# Patient Record
Sex: Male | Born: 1960 | Race: Black or African American | Hispanic: No | Marital: Married | Smoking: Never smoker
Health system: Southern US, Community
[De-identification: ages and names within clinical notes are randomized; demographics above are authoritative.]

## PROBLEM LIST (undated history)

## (undated) DIAGNOSIS — I10 Essential (primary) hypertension: Secondary | ICD-10-CM

---

## 2001-02-09 ENCOUNTER — Encounter: Payer: Self-pay | Admitting: Family Medicine

## 2001-02-09 ENCOUNTER — Ambulatory Visit (HOSPITAL_COMMUNITY): Admission: RE | Admit: 2001-02-09 | Discharge: 2001-02-09 | Payer: Self-pay | Admitting: Family Medicine

## 2001-08-11 ENCOUNTER — Encounter: Admission: RE | Admit: 2001-08-11 | Discharge: 2001-08-11 | Payer: Self-pay | Admitting: Internal Medicine

## 2001-08-25 ENCOUNTER — Encounter: Admission: RE | Admit: 2001-08-25 | Discharge: 2001-08-25 | Payer: Self-pay | Admitting: Neurosurgery

## 2001-09-08 ENCOUNTER — Encounter: Admission: RE | Admit: 2001-09-08 | Discharge: 2001-09-08 | Payer: Self-pay | Admitting: Neurosurgery

## 2001-09-11 ENCOUNTER — Encounter: Payer: Self-pay | Admitting: Family Medicine

## 2001-09-11 ENCOUNTER — Ambulatory Visit (HOSPITAL_COMMUNITY): Admission: RE | Admit: 2001-09-11 | Discharge: 2001-09-11 | Payer: Self-pay | Admitting: Family Medicine

## 2001-12-28 ENCOUNTER — Ambulatory Visit (HOSPITAL_COMMUNITY): Admission: RE | Admit: 2001-12-28 | Discharge: 2001-12-28 | Payer: Self-pay | Admitting: Neurosurgery

## 2001-12-28 ENCOUNTER — Encounter: Payer: Self-pay | Admitting: Neurosurgery

## 2003-08-01 ENCOUNTER — Ambulatory Visit (HOSPITAL_COMMUNITY): Admission: RE | Admit: 2003-08-01 | Discharge: 2003-08-01 | Payer: Self-pay | Admitting: Internal Medicine

## 2011-10-10 ENCOUNTER — Encounter (HOSPITAL_COMMUNITY): Payer: Self-pay | Admitting: *Deleted

## 2011-10-10 ENCOUNTER — Emergency Department (HOSPITAL_COMMUNITY)
Admission: EM | Admit: 2011-10-10 | Discharge: 2011-10-10 | Disposition: A | Discharge status: Home / Self Care | Payer: BC Managed Care – PPO | Attending: Emergency Medicine | Admitting: Emergency Medicine

## 2011-10-10 DIAGNOSIS — J111 Influenza due to unidentified influenza virus with other respiratory manifestations: Secondary | ICD-10-CM

## 2011-10-10 DIAGNOSIS — I10 Essential (primary) hypertension: Secondary | ICD-10-CM | POA: Insufficient documentation

## 2011-10-10 DIAGNOSIS — E119 Type 2 diabetes mellitus without complications: Secondary | ICD-10-CM | POA: Insufficient documentation

## 2011-10-10 HISTORY — DX: Essential (primary) hypertension: I10

## 2011-10-10 LAB — GLUCOSE, CAPILLARY: Glucose-Capillary: 257 mg/dL — ABNORMAL HIGH (ref 70–99)

## 2011-10-10 MED ORDER — METOCLOPRAMIDE HCL 5 MG/ML IJ SOLN
10.0000 mg | Freq: Once | INTRAMUSCULAR | Status: AC
  Administered 2011-10-10: 10 mg via INTRAVENOUS
  Filled 2011-10-10: qty 2

## 2011-10-10 MED ORDER — SODIUM CHLORIDE 0.9 % IV BOLUS (SEPSIS)
2000.0000 mL | Freq: Once | INTRAVENOUS | Status: AC
  Administered 2011-10-10: 2000 mL via INTRAVENOUS

## 2011-10-10 MED ORDER — DIPHENHYDRAMINE HCL 50 MG/ML IJ SOLN
25.0000 mg | Freq: Once | INTRAMUSCULAR | Status: AC
  Administered 2011-10-10: 25 mg via INTRAVENOUS
  Filled 2011-10-10: qty 1

## 2011-10-10 MED ORDER — KETOROLAC TROMETHAMINE 30 MG/ML IJ SOLN
15.0000 mg | Freq: Once | INTRAMUSCULAR | Status: AC
  Administered 2011-10-10: 15 mg via INTRAVENOUS
  Filled 2011-10-10: qty 1

## 2011-10-10 MED ORDER — ACETAMINOPHEN 325 MG PO TABS
ORAL_TABLET | ORAL | Status: AC
  Administered 2011-10-10: 650 mg via ORAL
  Filled 2011-10-10: qty 2

## 2011-10-10 MED ORDER — ACETAMINOPHEN 325 MG PO TABS
650.0000 mg | ORAL_TABLET | Freq: Once | ORAL | Status: AC
  Administered 2011-10-10: 650 mg via ORAL

## 2011-10-10 NOTE — ED Notes (Signed)
Patient stating he is feeling much better.

## 2011-10-10 NOTE — ED Notes (Signed)
Patient c/o headache, fever, chills, and generalized body aches. Patient clammy to touch.

## 2011-10-10 NOTE — ED Provider Notes (Signed)
History    50ym with sore throat. Also c/o HA, cough, nausea and vomiting. Feels congested. Fever to 102. Chills. Symptom onset Friday and persistent since. Multiple sick contacts at work. HA generalized and achy. Gradual onset. No visual complaints. No numbness, tingling or loss of strength. No confusion per pt and family. Vomiting x2 yesterday. Cough productive for whiitish sputum and noticed some blood mixed in with sputum today. No significant CP or SOB. No unusual leg pain or swelling. No rash. Denies hx of blood clot. No urinary complaints.  CSN: [id number]  Arrival date & time 10/10/11  1106   First MD Initiated Contact with Patient 10/10/11 1300      Chief Complaint  Patient presents with  . Headache  . Sore Throat  . Fever  . Nausea  . Emesis    (Consider location/radiation/quality/duration/timing/severity/associated sxs/prior treatment) HPI  Past Medical History  Diagnosis Date  . Diabetes mellitus   . Hypertension     History reviewed. No pertinent past surgical history.  No family history on file.  History  Substance Use Topics  . Smoking status: Never Smoker   . Smokeless tobacco: Not on file  . Alcohol Use: No      Review of Systems   Review of symptoms negative unless otherwise noted in HPI.   Allergies  Review of patient's allergies indicates no known allergies.  Home Medications   Current Outpatient Rx  Name Route Sig Dispense Refill  . AMOXICILLIN-POT CLAVULANATE 875-125 MG PO TABS Oral Take 1 tablet by mouth 2 (two) times daily.    Marland Kitchen GLIPIZIDE 5 MG PO TABS Oral Take 5 mg by mouth once.    . METFORMIN HCL 500 MG PO TABS Oral Take 500 mg by mouth daily.    . ADULT MULTIVITAMIN W/MINERALS CH Oral Take 1 tablet by mouth daily.    Marland Kitchen NAPHAZOLINE HCL 0.012 % OP SOLN Both Eyes Place 1 drop into both eyes daily as needed. Redness    . OLMESARTAN MEDOXOMIL 40 MG PO TABS Oral Take 40 mg by mouth daily.    Marland Kitchen SIMVASTATIN 40 MG PO TABS Oral Take 40 mg  by mouth every evening.      BP 112/64  Pulse 83  Temp(Src) 98.7 F (37.1 C) (Oral)  Resp 17  SpO2 97%  Physical Exam  Nursing note and vitals reviewed. Constitutional: He is oriented to person, place, and time. He appears well-developed and well-nourished. No distress.       Sitting up in bed. NAD.  HENT:  Head: Normocephalic and atraumatic.  Right Ear: External ear normal.  Left Ear: External ear normal.  Nose: Nose normal.       Mild erythema posterior pharynx. No evidence fo deep space infection.  Eyes: Conjunctivae and EOM are normal. Pupils are equal, round, and reactive to light. Right eye exhibits no discharge. Left eye exhibits no discharge.  Neck: Normal range of motion. Neck supple.  Cardiovascular: Normal rate, regular rhythm and normal heart sounds.  Exam reveals no gallop and no friction rub.   No murmur heard. Pulmonary/Chest: Effort normal and breath sounds normal. No respiratory distress. He has no wheezes. He has no rales. He exhibits no tenderness.  Abdominal: Soft. He exhibits no distension. There is no tenderness.  Genitourinary:       No cva tenderness  Musculoskeletal: He exhibits no edema and no tenderness.  Lymphadenopathy:    He has no cervical adenopathy.  Neurological: He is alert and oriented to person,  place, and time. No cranial nerve deficit. He exhibits normal muscle tone. Coordination normal.  Skin: Skin is warm and dry. He is not diaphoretic.  Psychiatric: He has a normal mood and affect. His behavior is normal. Thought content normal.    ED Course  Procedures (including critical care time)  Labs Reviewed  GLUCOSE, CAPILLARY - Abnormal; Notable for the following:    Glucose-Capillary 257 (*)    All other components within normal limits   No results found.   1. Flu       MDM  50yM with multiple complaints. Suspect viral illness, likely flu. Pt does not describe hematemesis. More consistent with minimal hemoptysis and low clinical  suspicion for urgent/emergent cause such as pneumonia or PE. Clinically well appearing. Feeling better with IVF and meds. Plan continued symptomatic tx and outpt fu.        Raeford Razor, MD 10/10/11 1538

## 2011-10-10 NOTE — ED Notes (Signed)
Patient with no complaints at this time. Respirations even and unlabored. Skin warm/dry. Discharge instructions reviewed with patient at this time. Patient given opportunity to voice concerns/ask questions. IV removed per policy and band-aid applied to site. Patient discharged at this time and left Emergency Department with steady gait.  

## 2011-10-10 NOTE — ED Notes (Signed)
Pt c/o sore throat, headache, congestion, n/v, fever, pt states that he has noticed some blood when he throws up.

## 2011-11-17 ENCOUNTER — Other Ambulatory Visit: Payer: Self-pay

## 2011-11-17 ENCOUNTER — Telehealth: Payer: Self-pay

## 2011-11-17 DIAGNOSIS — Z139 Encounter for screening, unspecified: Secondary | ICD-10-CM

## 2011-11-17 NOTE — Telephone Encounter (Signed)
OK to schedule. Day of prep: glucotrol 2.5mg  am. glucophage 500mg  am.

## 2011-11-17 NOTE — Telephone Encounter (Signed)
Gastroenterology Pre-Procedure Form     Request Date: 11/17/2011       Requesting Physician: Sherwood Gambler     PATIENT INFORMATION:  Jose Peters is a 51 y.o., male (DOB=19-Mar-1961).  PROCEDURE: Procedure(s) requested: colonoscopy Procedure Reason: screening for colon cancer  PATIENT REVIEW QUESTIONS: The patient reports the following:   1. Diabetes Melitis: yes  2. Joint replacements in the past 12 months: no 3. Major health problems in the past 3 months: no 4. Has an artificial valve or MVP:no 5. Has been advised in past to take antibiotics in advance of a procedure like teeth cleaning: no}    MEDICATIONS & ALLERGIES:    Patient reports the following regarding taking any blood thinners:   Plavix? no Aspirin?no Coumadin?  no  Patient confirms/reports the following medications:  Current Outpatient Prescriptions  Medication Sig Dispense Refill  . glipiZIDE (GLUCOTROL) 5 MG tablet Take 5 mg by mouth once. Takes in the AM      . metFORMIN (GLUCOPHAGE) 500 MG tablet Take 1,000 mg by mouth daily. Pt takes one 1000 mg tablet in the AM      . Multiple Vitamin (MULITIVITAMIN WITH MINERALS) TABS Take 1 tablet by mouth daily.      . naphazoline (CLEAR EYES) 0.012 % ophthalmic solution Place 1 drop into both eyes daily as needed. Redness       . olmesartan (BENICAR) 40 MG tablet Take 40 mg by mouth daily.      . simvastatin (ZOCOR) 40 MG tablet Take 40 mg by mouth every evening.      Marland Kitchen amoxicillin-clavulanate (AUGMENTIN) 875-125 MG per tablet Take 1 tablet by mouth 2 (two) times daily.        Patient confirms/reports the following allergies:  No Known Allergies  Patient is appropriate to schedule for requested procedure(s): yes  AUTHORIZATION INFORMATION Primary Insurance:   ID #:   Group #:  Pre-Cert / Auth required:  Pre-Cert / Auth #:   Secondary Insurance:   ID #:   Group #:  Pre-Cert / Auth required: Pre-Cert / Auth #:   No orders of the defined types were placed in this  encounter.    SCHEDULE INFORMATION: Procedure has been scheduled as follows:  Date: 12/08/2011       Time: 9:15 AM  Location: [facility] Short Stay  This Gastroenterology Pre-Precedure Form is being routed to the following provider(s) for review: R. Roetta Sessions, MD

## 2011-11-18 NOTE — Telephone Encounter (Signed)
Rx and instructions mailed to pt.  

## 2011-12-07 ENCOUNTER — Encounter (HOSPITAL_COMMUNITY): Payer: Self-pay | Admitting: Pharmacy Technician

## 2011-12-07 MED ORDER — SODIUM CHLORIDE 0.45 % IV SOLN
Freq: Once | INTRAVENOUS | Status: AC
  Administered 2011-12-08: 09:00:00 via INTRAVENOUS

## 2011-12-08 ENCOUNTER — Encounter (HOSPITAL_COMMUNITY)
Admission: RE | Disposition: A | Discharge status: Home / Self Care | Payer: Self-pay | Source: Ambulatory Visit | Attending: Internal Medicine

## 2011-12-08 ENCOUNTER — Encounter (HOSPITAL_COMMUNITY): Payer: Self-pay | Admitting: *Deleted

## 2011-12-08 ENCOUNTER — Ambulatory Visit (HOSPITAL_COMMUNITY)
Admission: RE | Admit: 2011-12-08 | Discharge: 2011-12-08 | Disposition: A | Discharge status: Home / Self Care | Payer: BC Managed Care – PPO | Source: Ambulatory Visit | Attending: Internal Medicine | Admitting: Internal Medicine

## 2011-12-08 DIAGNOSIS — I10 Essential (primary) hypertension: Secondary | ICD-10-CM | POA: Insufficient documentation

## 2011-12-08 DIAGNOSIS — Z01812 Encounter for preprocedural laboratory examination: Secondary | ICD-10-CM | POA: Insufficient documentation

## 2011-12-08 DIAGNOSIS — Z139 Encounter for screening, unspecified: Secondary | ICD-10-CM

## 2011-12-08 DIAGNOSIS — Z1211 Encounter for screening for malignant neoplasm of colon: Secondary | ICD-10-CM | POA: Insufficient documentation

## 2011-12-08 DIAGNOSIS — Z79899 Other long term (current) drug therapy: Secondary | ICD-10-CM | POA: Insufficient documentation

## 2011-12-08 DIAGNOSIS — E119 Type 2 diabetes mellitus without complications: Secondary | ICD-10-CM | POA: Insufficient documentation

## 2011-12-08 HISTORY — PX: COLONOSCOPY: SHX5424

## 2011-12-08 SURGERY — COLONOSCOPY
Anesthesia: Moderate Sedation

## 2011-12-08 MED ORDER — STERILE WATER FOR IRRIGATION IR SOLN
Status: DC | PRN
  Administered 2011-12-08: 10:00:00

## 2011-12-08 MED ORDER — MIDAZOLAM HCL 5 MG/5ML IJ SOLN
INTRAMUSCULAR | Status: AC
  Filled 2011-12-08: qty 10

## 2011-12-08 MED ORDER — MEPERIDINE HCL 100 MG/ML IJ SOLN
INTRAMUSCULAR | Status: DC | PRN
  Administered 2011-12-08 (×2): 50 mg via INTRAVENOUS

## 2011-12-08 MED ORDER — MEPERIDINE HCL 100 MG/ML IJ SOLN
INTRAMUSCULAR | Status: AC
  Filled 2011-12-08: qty 1

## 2011-12-08 MED ORDER — MIDAZOLAM HCL 5 MG/5ML IJ SOLN
INTRAMUSCULAR | Status: DC | PRN
  Administered 2011-12-08: 1 mg via INTRAVENOUS
  Administered 2011-12-08 (×2): 2 mg via INTRAVENOUS

## 2011-12-08 NOTE — H&P (Signed)
Primary Care Physician:  Ardell Isaacs, RN, RN Level II Primary Gastroenterologist:  Dr.   Pre-Procedure History & Physical: HPI:  Jose Peters is a 51 y.o. male is here for a screening colonoscopy. First screening examination. No bowel symptoms. No family history: Polyps or colon cancer  Past Medical History  Diagnosis Date  . Diabetes mellitus   . Hypertension     History reviewed. No pertinent past surgical history.  Prior to Admission medications   Medication Sig Start Date End Date Taking? Authorizing Provider  b complex vitamins capsule Take 1 capsule by mouth daily.   Yes Historical Provider, MD  glipiZIDE (GLUCOTROL) 5 MG tablet Take 5 mg by mouth daily.    Yes Historical Provider, MD  metFORMIN (GLUCOPHAGE) 500 MG tablet Take 1,000 mg by mouth 2 (two) times daily. Pt takes one 1000 mg tablet in the AM   Yes Historical Provider, MD  Multiple Vitamin (MULITIVITAMIN WITH MINERALS) TABS Take 1 tablet by mouth daily.   Yes Historical Provider, MD  naphazoline (CLEAR EYES) 0.012 % ophthalmic solution Place 1 drop into both eyes daily as needed. Redness    Yes Historical Provider, MD  olmesartan (BENICAR) 40 MG tablet Take 40 mg by mouth daily.   Yes Historical Provider, MD  simvastatin (ZOCOR) 40 MG tablet Take 40 mg by mouth every evening.   Yes Historical Provider, MD    Allergies as of 11/17/2011  . (No Known Allergies)    History reviewed. No pertinent family history.  History   Social History  . Marital Status: Married    Spouse Name: N/A    Number of Children: N/A  . Years of Education: N/A   Occupational History  . Not on file.   Social History Main Topics  . Smoking status: Never Smoker   . Smokeless tobacco: Not on file  . Alcohol Use: No  . Drug Use: No  . Sexually Active:    Other Topics Concern  . Not on file   Social History Narrative  . No narrative on file    Review of Systems: See HPI, otherwise negative ROS  Physical Exam: BP  132/84  Pulse 100  Temp(Src) 97.7 F (36.5 C) (Oral)  Resp 18  Ht 6' (1.829 m)  Wt 203 lb (92.08 kg)  BMI 27.53 kg/m2  SpO2 100% General:   Alert,  Well-developed, well-nourished, pleasant and cooperative in NAD Head:  Normocephalic and atraumatic. Eyes:  Sclera clear, no icterus.   Conjunctiva pink. Ears:  Normal auditory acuity. Nose:  No deformity, discharge,  or lesions. Mouth:  No deformity or lesions, dentition normal. Neck:  Supple; no masses or thyromegaly. Lungs:  Clear throughout to auscultation.   No wheezes, crackles, or rhonchi. No acute distress. Heart:  Regular rate and rhythm; no murmurs, clicks, rubs,  or gallops. Abdomen:  Soft, nontender and nondistended. No masses, hepatosplenomegaly or hernias noted. Normal bowel sounds, without guarding, and without rebound.   Msk:  Symmetrical without gross deformities. Normal posture. Pulses:  Normal pulses noted. Extremities:  Without clubbing or edema. Neurologic:  Alert and  oriented x4;  grossly normal neurologically. Skin:  Intact without significant lesions or rashes. Cervical Nodes:  No significant cervical adenopathy. Psych:  Alert and cooperative. Normal mood and affect.  Impression/Plan: [name] is now here to undergo a screening colonoscopy. First-ever average risk screening examination.  Risks, benefits, limitations, imponderables and alternatives regarding colonoscopy have been reviewed with the patient. Questions have been answered.  All parties agreeable.

## 2011-12-08 NOTE — Op Note (Signed)
[facility] [address] [place], [place]  [address]  COLONOSCOPY PROCEDURE REPORT  PATIENT:  Jose Peters, Jose Peters  MR#:  [id number] BIRTHDATE:  February 28, 1961, 50 yrs. old  GENDER:  male ENDOSCOPIST:  R. Roetta Sessions, MD FACP [facility] REF. BY:  Artis Delay, M.D. PROCEDURE DATE:  12/08/2011 PROCEDURE:  screening colonoscopy INDICATIONS:  first ever average risk colorectal cancer screening exam  INFORMED CONSENT:  The risks, benefits, alternatives and imponderables including but not limited to bleeding, perforation as well as the possibility of a missed lesion have been reviewed. The potential for biopsy, lesion removal, etc. have also been discussed.  Questions have been answered.  All parties agreeable. Please see the history and physical in the medical record for more information.  MEDICATIONS:  Versed 5 mg IV and Demerol 100 mg IV in divided doses.  DESCRIPTION OF PROCEDURE:  After a digital rectal exam was performed, the EC-[id number] ([id number]) colonoscope was advanced from the anus through the rectum and colon to the area of the cecum, ileocecal valve and appendiceal orifice.  The cecum was deeply intubated.  These structures were well-seen and photographed for the record.  From the level of the cecum and ileocecal valve, the scope was slowly and cautiously withdrawn.  The mucosal surfaces were carefully surveyed utilizing scope tip deflection to facilitate fold flattening as needed.  The scope was pulled down into the rectum where a thorough examination including retroflexion was performed. <<PROCEDUREIMAGES>>  FINDINGS:    adequate preparation. Normal colon. Normal rectum.  THERAPEUTIC / DIAGNOSTIC MANEUVERS PERFORMED:none  COMPLICATIONS:  none  CECAL WITHDRAWAL TIME:12 minutes  IMPRESSION:     Normal colonoscopy  RECOMMENDATIONS: Repeat colonoscopy for screening purposes in 10 years  ______________________________ R. Roetta Sessions, MD Caleen Essex  CC:  Artis Delay,  M.D.  n. eSIGNED:   R. Roetta Sessions at 12/08/2011 10:14 AM  Rolan Bucco, [phone]

## 2011-12-10 ENCOUNTER — Encounter (HOSPITAL_COMMUNITY): Payer: Self-pay | Admitting: Internal Medicine

## 2016-03-02 DIAGNOSIS — E559 Vitamin D deficiency, unspecified: Secondary | ICD-10-CM | POA: Diagnosis not present

## 2016-03-02 DIAGNOSIS — Z79899 Other long term (current) drug therapy: Secondary | ICD-10-CM | POA: Diagnosis not present

## 2016-03-02 DIAGNOSIS — I10 Essential (primary) hypertension: Secondary | ICD-10-CM | POA: Diagnosis not present

## 2016-03-02 DIAGNOSIS — E119 Type 2 diabetes mellitus without complications: Secondary | ICD-10-CM | POA: Diagnosis not present

## 2016-03-02 DIAGNOSIS — E785 Hyperlipidemia, unspecified: Secondary | ICD-10-CM | POA: Diagnosis not present

## 2016-03-02 DIAGNOSIS — Z008 Encounter for other general examination: Secondary | ICD-10-CM | POA: Diagnosis not present

## 2016-03-12 DIAGNOSIS — E119 Type 2 diabetes mellitus without complications: Secondary | ICD-10-CM | POA: Diagnosis not present

## 2016-03-12 DIAGNOSIS — I10 Essential (primary) hypertension: Secondary | ICD-10-CM | POA: Diagnosis not present

## 2016-05-06 DIAGNOSIS — I10 Essential (primary) hypertension: Secondary | ICD-10-CM | POA: Diagnosis not present

## 2016-05-06 DIAGNOSIS — E1165 Type 2 diabetes mellitus with hyperglycemia: Secondary | ICD-10-CM | POA: Diagnosis not present

## 2016-05-06 DIAGNOSIS — Z6826 Body mass index (BMI) 26.0-26.9, adult: Secondary | ICD-10-CM | POA: Diagnosis not present

## 2016-05-06 DIAGNOSIS — Z1389 Encounter for screening for other disorder: Secondary | ICD-10-CM | POA: Diagnosis not present

## 2016-05-06 DIAGNOSIS — M7061 Trochanteric bursitis, right hip: Secondary | ICD-10-CM | POA: Diagnosis not present

## 2016-05-06 DIAGNOSIS — E782 Mixed hyperlipidemia: Secondary | ICD-10-CM | POA: Diagnosis not present

## 2016-05-11 DIAGNOSIS — I10 Essential (primary) hypertension: Secondary | ICD-10-CM | POA: Diagnosis not present

## 2016-05-11 DIAGNOSIS — E785 Hyperlipidemia, unspecified: Secondary | ICD-10-CM | POA: Diagnosis not present

## 2016-05-11 DIAGNOSIS — E119 Type 2 diabetes mellitus without complications: Secondary | ICD-10-CM | POA: Diagnosis not present

## 2016-05-11 DIAGNOSIS — Z008 Encounter for other general examination: Secondary | ICD-10-CM | POA: Diagnosis not present

## 2016-06-15 DIAGNOSIS — E559 Vitamin D deficiency, unspecified: Secondary | ICD-10-CM | POA: Diagnosis not present

## 2016-06-15 DIAGNOSIS — Z139 Encounter for screening, unspecified: Secondary | ICD-10-CM | POA: Diagnosis not present

## 2016-06-15 DIAGNOSIS — E119 Type 2 diabetes mellitus without complications: Secondary | ICD-10-CM | POA: Diagnosis not present

## 2016-06-15 DIAGNOSIS — Z79899 Other long term (current) drug therapy: Secondary | ICD-10-CM | POA: Diagnosis not present

## 2016-06-15 DIAGNOSIS — E785 Hyperlipidemia, unspecified: Secondary | ICD-10-CM | POA: Diagnosis not present

## 2016-06-24 DIAGNOSIS — E559 Vitamin D deficiency, unspecified: Secondary | ICD-10-CM | POA: Diagnosis not present

## 2016-06-24 DIAGNOSIS — I10 Essential (primary) hypertension: Secondary | ICD-10-CM | POA: Diagnosis not present

## 2016-06-24 DIAGNOSIS — E119 Type 2 diabetes mellitus without complications: Secondary | ICD-10-CM | POA: Diagnosis not present

## 2016-06-24 DIAGNOSIS — E785 Hyperlipidemia, unspecified: Secondary | ICD-10-CM | POA: Diagnosis not present

## 2016-07-03 DIAGNOSIS — Z23 Encounter for immunization: Secondary | ICD-10-CM | POA: Diagnosis not present

## 2016-08-06 DIAGNOSIS — E663 Overweight: Secondary | ICD-10-CM | POA: Diagnosis not present

## 2016-08-06 DIAGNOSIS — Z6826 Body mass index (BMI) 26.0-26.9, adult: Secondary | ICD-10-CM | POA: Diagnosis not present

## 2016-08-06 DIAGNOSIS — E782 Mixed hyperlipidemia: Secondary | ICD-10-CM | POA: Diagnosis not present

## 2016-08-06 DIAGNOSIS — E1165 Type 2 diabetes mellitus with hyperglycemia: Secondary | ICD-10-CM | POA: Diagnosis not present

## 2016-08-06 DIAGNOSIS — Z125 Encounter for screening for malignant neoplasm of prostate: Secondary | ICD-10-CM | POA: Diagnosis not present

## 2016-08-06 DIAGNOSIS — I10 Essential (primary) hypertension: Secondary | ICD-10-CM | POA: Diagnosis not present

## 2016-08-06 DIAGNOSIS — E119 Type 2 diabetes mellitus without complications: Secondary | ICD-10-CM | POA: Diagnosis not present

## 2016-08-24 DIAGNOSIS — E119 Type 2 diabetes mellitus without complications: Secondary | ICD-10-CM | POA: Diagnosis not present

## 2016-08-24 DIAGNOSIS — I10 Essential (primary) hypertension: Secondary | ICD-10-CM | POA: Diagnosis not present

## 2016-08-24 DIAGNOSIS — E785 Hyperlipidemia, unspecified: Secondary | ICD-10-CM | POA: Diagnosis not present

## 2016-08-24 DIAGNOSIS — Z008 Encounter for other general examination: Secondary | ICD-10-CM | POA: Diagnosis not present

## 2016-11-08 DIAGNOSIS — Z1389 Encounter for screening for other disorder: Secondary | ICD-10-CM | POA: Diagnosis not present

## 2016-11-08 DIAGNOSIS — E663 Overweight: Secondary | ICD-10-CM | POA: Diagnosis not present

## 2016-11-08 DIAGNOSIS — F330 Major depressive disorder, recurrent, mild: Secondary | ICD-10-CM | POA: Diagnosis not present

## 2016-11-08 DIAGNOSIS — E1165 Type 2 diabetes mellitus with hyperglycemia: Secondary | ICD-10-CM | POA: Diagnosis not present

## 2016-11-08 DIAGNOSIS — F329 Major depressive disorder, single episode, unspecified: Secondary | ICD-10-CM | POA: Diagnosis not present

## 2016-11-08 DIAGNOSIS — Z719 Counseling, unspecified: Secondary | ICD-10-CM | POA: Diagnosis not present

## 2016-11-08 DIAGNOSIS — Z6827 Body mass index (BMI) 27.0-27.9, adult: Secondary | ICD-10-CM | POA: Diagnosis not present

## 2016-11-08 DIAGNOSIS — I10 Essential (primary) hypertension: Secondary | ICD-10-CM | POA: Diagnosis not present

## 2016-11-22 DIAGNOSIS — Z1389 Encounter for screening for other disorder: Secondary | ICD-10-CM | POA: Diagnosis not present

## 2016-11-22 DIAGNOSIS — J329 Chronic sinusitis, unspecified: Secondary | ICD-10-CM | POA: Diagnosis not present

## 2016-11-22 DIAGNOSIS — R070 Pain in throat: Secondary | ICD-10-CM | POA: Diagnosis not present

## 2016-11-22 DIAGNOSIS — J343 Hypertrophy of nasal turbinates: Secondary | ICD-10-CM | POA: Diagnosis not present

## 2016-11-22 DIAGNOSIS — H6692 Otitis media, unspecified, left ear: Secondary | ICD-10-CM | POA: Diagnosis not present

## 2016-11-22 DIAGNOSIS — Z6827 Body mass index (BMI) 27.0-27.9, adult: Secondary | ICD-10-CM | POA: Diagnosis not present

## 2016-11-22 DIAGNOSIS — J069 Acute upper respiratory infection, unspecified: Secondary | ICD-10-CM | POA: Diagnosis not present

## 2016-11-30 DIAGNOSIS — Z008 Encounter for other general examination: Secondary | ICD-10-CM | POA: Diagnosis not present

## 2016-11-30 DIAGNOSIS — E119 Type 2 diabetes mellitus without complications: Secondary | ICD-10-CM | POA: Diagnosis not present

## 2016-11-30 DIAGNOSIS — I10 Essential (primary) hypertension: Secondary | ICD-10-CM | POA: Diagnosis not present

## 2016-11-30 DIAGNOSIS — E785 Hyperlipidemia, unspecified: Secondary | ICD-10-CM | POA: Diagnosis not present

## 2016-11-30 DIAGNOSIS — Z713 Dietary counseling and surveillance: Secondary | ICD-10-CM | POA: Diagnosis not present

## 2016-11-30 DIAGNOSIS — Z79899 Other long term (current) drug therapy: Secondary | ICD-10-CM | POA: Diagnosis not present

## 2016-11-30 DIAGNOSIS — Z139 Encounter for screening, unspecified: Secondary | ICD-10-CM | POA: Diagnosis not present

## 2016-12-09 DIAGNOSIS — E785 Hyperlipidemia, unspecified: Secondary | ICD-10-CM | POA: Diagnosis not present

## 2016-12-09 DIAGNOSIS — E119 Type 2 diabetes mellitus without complications: Secondary | ICD-10-CM | POA: Diagnosis not present

## 2017-02-23 DIAGNOSIS — E119 Type 2 diabetes mellitus without complications: Secondary | ICD-10-CM | POA: Diagnosis not present

## 2017-02-23 DIAGNOSIS — I10 Essential (primary) hypertension: Secondary | ICD-10-CM | POA: Diagnosis not present

## 2017-02-23 DIAGNOSIS — F329 Major depressive disorder, single episode, unspecified: Secondary | ICD-10-CM | POA: Diagnosis not present

## 2017-02-23 DIAGNOSIS — E663 Overweight: Secondary | ICD-10-CM | POA: Diagnosis not present

## 2017-02-23 DIAGNOSIS — Z1389 Encounter for screening for other disorder: Secondary | ICD-10-CM | POA: Diagnosis not present

## 2017-03-08 DIAGNOSIS — I10 Essential (primary) hypertension: Secondary | ICD-10-CM | POA: Diagnosis not present

## 2017-03-08 DIAGNOSIS — E119 Type 2 diabetes mellitus without complications: Secondary | ICD-10-CM | POA: Diagnosis not present

## 2017-03-08 DIAGNOSIS — Z008 Encounter for other general examination: Secondary | ICD-10-CM | POA: Diagnosis not present

## 2017-03-08 DIAGNOSIS — Z719 Counseling, unspecified: Secondary | ICD-10-CM | POA: Diagnosis not present

## 2017-03-08 DIAGNOSIS — E785 Hyperlipidemia, unspecified: Secondary | ICD-10-CM | POA: Diagnosis not present

## 2017-03-15 DIAGNOSIS — E785 Hyperlipidemia, unspecified: Secondary | ICD-10-CM | POA: Diagnosis not present

## 2017-03-15 DIAGNOSIS — E119 Type 2 diabetes mellitus without complications: Secondary | ICD-10-CM | POA: Diagnosis not present

## 2017-03-15 DIAGNOSIS — I10 Essential (primary) hypertension: Secondary | ICD-10-CM | POA: Diagnosis not present

## 2017-04-07 DIAGNOSIS — Z6827 Body mass index (BMI) 27.0-27.9, adult: Secondary | ICD-10-CM | POA: Diagnosis not present

## 2017-04-07 DIAGNOSIS — E663 Overweight: Secondary | ICD-10-CM | POA: Diagnosis not present

## 2017-04-07 DIAGNOSIS — M7061 Trochanteric bursitis, right hip: Secondary | ICD-10-CM | POA: Diagnosis not present

## 2017-04-07 DIAGNOSIS — Z1389 Encounter for screening for other disorder: Secondary | ICD-10-CM | POA: Diagnosis not present

## 2017-05-31 DIAGNOSIS — E119 Type 2 diabetes mellitus without complications: Secondary | ICD-10-CM | POA: Diagnosis not present

## 2017-05-31 DIAGNOSIS — I10 Essential (primary) hypertension: Secondary | ICD-10-CM | POA: Diagnosis not present

## 2017-05-31 DIAGNOSIS — Z139 Encounter for screening, unspecified: Secondary | ICD-10-CM | POA: Diagnosis not present

## 2017-05-31 DIAGNOSIS — E785 Hyperlipidemia, unspecified: Secondary | ICD-10-CM | POA: Diagnosis not present

## 2017-05-31 DIAGNOSIS — Z719 Counseling, unspecified: Secondary | ICD-10-CM | POA: Diagnosis not present

## 2017-05-31 DIAGNOSIS — Z008 Encounter for other general examination: Secondary | ICD-10-CM | POA: Diagnosis not present

## 2017-06-30 DIAGNOSIS — E119 Type 2 diabetes mellitus without complications: Secondary | ICD-10-CM | POA: Diagnosis not present

## 2017-06-30 DIAGNOSIS — E785 Hyperlipidemia, unspecified: Secondary | ICD-10-CM | POA: Diagnosis not present

## 2017-06-30 DIAGNOSIS — I10 Essential (primary) hypertension: Secondary | ICD-10-CM | POA: Diagnosis not present

## 2017-07-01 DIAGNOSIS — Z6827 Body mass index (BMI) 27.0-27.9, adult: Secondary | ICD-10-CM | POA: Diagnosis not present

## 2017-07-01 DIAGNOSIS — E1165 Type 2 diabetes mellitus with hyperglycemia: Secondary | ICD-10-CM | POA: Diagnosis not present

## 2017-07-01 DIAGNOSIS — Z23 Encounter for immunization: Secondary | ICD-10-CM | POA: Diagnosis not present

## 2017-07-01 DIAGNOSIS — M5489 Other dorsalgia: Secondary | ICD-10-CM | POA: Diagnosis not present

## 2017-07-01 DIAGNOSIS — Z1389 Encounter for screening for other disorder: Secondary | ICD-10-CM | POA: Diagnosis not present

## 2017-07-01 DIAGNOSIS — E782 Mixed hyperlipidemia: Secondary | ICD-10-CM | POA: Diagnosis not present

## 2017-08-23 DIAGNOSIS — E785 Hyperlipidemia, unspecified: Secondary | ICD-10-CM | POA: Diagnosis not present

## 2017-08-23 DIAGNOSIS — E663 Overweight: Secondary | ICD-10-CM | POA: Diagnosis not present

## 2017-08-23 DIAGNOSIS — E119 Type 2 diabetes mellitus without complications: Secondary | ICD-10-CM | POA: Diagnosis not present

## 2017-08-23 DIAGNOSIS — I10 Essential (primary) hypertension: Secondary | ICD-10-CM | POA: Diagnosis not present

## 2017-08-23 DIAGNOSIS — Z719 Counseling, unspecified: Secondary | ICD-10-CM | POA: Diagnosis not present

## 2017-08-23 DIAGNOSIS — Z008 Encounter for other general examination: Secondary | ICD-10-CM | POA: Diagnosis not present

## 2017-10-11 DIAGNOSIS — E119 Type 2 diabetes mellitus without complications: Secondary | ICD-10-CM | POA: Diagnosis not present

## 2017-10-11 DIAGNOSIS — Z79899 Other long term (current) drug therapy: Secondary | ICD-10-CM | POA: Diagnosis not present

## 2017-10-11 DIAGNOSIS — Z719 Counseling, unspecified: Secondary | ICD-10-CM | POA: Diagnosis not present

## 2017-10-11 DIAGNOSIS — E785 Hyperlipidemia, unspecified: Secondary | ICD-10-CM | POA: Diagnosis not present

## 2017-10-11 DIAGNOSIS — Z008 Encounter for other general examination: Secondary | ICD-10-CM | POA: Diagnosis not present

## 2017-10-25 DIAGNOSIS — I10 Essential (primary) hypertension: Secondary | ICD-10-CM | POA: Diagnosis not present

## 2017-10-25 DIAGNOSIS — E119 Type 2 diabetes mellitus without complications: Secondary | ICD-10-CM | POA: Diagnosis not present

## 2017-10-25 DIAGNOSIS — E785 Hyperlipidemia, unspecified: Secondary | ICD-10-CM | POA: Diagnosis not present

## 2017-10-25 DIAGNOSIS — Z719 Counseling, unspecified: Secondary | ICD-10-CM | POA: Diagnosis not present

## 2017-10-28 DIAGNOSIS — E663 Overweight: Secondary | ICD-10-CM | POA: Diagnosis not present

## 2017-10-28 DIAGNOSIS — E1165 Type 2 diabetes mellitus with hyperglycemia: Secondary | ICD-10-CM | POA: Diagnosis not present

## 2017-10-28 DIAGNOSIS — E782 Mixed hyperlipidemia: Secondary | ICD-10-CM | POA: Diagnosis not present

## 2017-10-28 DIAGNOSIS — Z6825 Body mass index (BMI) 25.0-25.9, adult: Secondary | ICD-10-CM | POA: Diagnosis not present

## 2017-10-28 DIAGNOSIS — I10 Essential (primary) hypertension: Secondary | ICD-10-CM | POA: Diagnosis not present

## 2018-02-14 DIAGNOSIS — Z719 Counseling, unspecified: Secondary | ICD-10-CM | POA: Diagnosis not present

## 2018-02-14 DIAGNOSIS — Z008 Encounter for other general examination: Secondary | ICD-10-CM | POA: Diagnosis not present

## 2018-02-14 DIAGNOSIS — E119 Type 2 diabetes mellitus without complications: Secondary | ICD-10-CM | POA: Diagnosis not present

## 2018-02-14 DIAGNOSIS — Z79899 Other long term (current) drug therapy: Secondary | ICD-10-CM | POA: Diagnosis not present

## 2018-02-14 DIAGNOSIS — E785 Hyperlipidemia, unspecified: Secondary | ICD-10-CM | POA: Diagnosis not present

## 2018-02-28 DIAGNOSIS — E663 Overweight: Secondary | ICD-10-CM | POA: Diagnosis not present

## 2018-02-28 DIAGNOSIS — Z6825 Body mass index (BMI) 25.0-25.9, adult: Secondary | ICD-10-CM | POA: Diagnosis not present

## 2018-02-28 DIAGNOSIS — T07XXXA Unspecified multiple injuries, initial encounter: Secondary | ICD-10-CM | POA: Diagnosis not present

## 2018-02-28 DIAGNOSIS — Z1389 Encounter for screening for other disorder: Secondary | ICD-10-CM | POA: Diagnosis not present

## 2018-05-02 DIAGNOSIS — E119 Type 2 diabetes mellitus without complications: Secondary | ICD-10-CM | POA: Diagnosis not present

## 2018-05-02 DIAGNOSIS — Z719 Counseling, unspecified: Secondary | ICD-10-CM | POA: Diagnosis not present

## 2018-05-02 DIAGNOSIS — E785 Hyperlipidemia, unspecified: Secondary | ICD-10-CM | POA: Diagnosis not present

## 2018-05-02 DIAGNOSIS — Z008 Encounter for other general examination: Secondary | ICD-10-CM | POA: Diagnosis not present

## 2018-06-06 DIAGNOSIS — Z139 Encounter for screening, unspecified: Secondary | ICD-10-CM | POA: Diagnosis not present

## 2018-06-06 DIAGNOSIS — Z79899 Other long term (current) drug therapy: Secondary | ICD-10-CM | POA: Diagnosis not present

## 2018-06-06 DIAGNOSIS — E785 Hyperlipidemia, unspecified: Secondary | ICD-10-CM | POA: Diagnosis not present

## 2018-06-06 DIAGNOSIS — E119 Type 2 diabetes mellitus without complications: Secondary | ICD-10-CM | POA: Diagnosis not present

## 2018-06-27 DIAGNOSIS — I10 Essential (primary) hypertension: Secondary | ICD-10-CM | POA: Diagnosis not present

## 2018-06-27 DIAGNOSIS — E785 Hyperlipidemia, unspecified: Secondary | ICD-10-CM | POA: Diagnosis not present

## 2018-06-27 DIAGNOSIS — Z008 Encounter for other general examination: Secondary | ICD-10-CM | POA: Diagnosis not present

## 2018-06-27 DIAGNOSIS — E119 Type 2 diabetes mellitus without complications: Secondary | ICD-10-CM | POA: Diagnosis not present

## 2018-06-27 DIAGNOSIS — Z23 Encounter for immunization: Secondary | ICD-10-CM | POA: Diagnosis not present

## 2018-07-13 DIAGNOSIS — Z6826 Body mass index (BMI) 26.0-26.9, adult: Secondary | ICD-10-CM | POA: Diagnosis not present

## 2018-07-13 DIAGNOSIS — E782 Mixed hyperlipidemia: Secondary | ICD-10-CM | POA: Diagnosis not present

## 2018-07-13 DIAGNOSIS — E119 Type 2 diabetes mellitus without complications: Secondary | ICD-10-CM | POA: Diagnosis not present

## 2018-07-13 DIAGNOSIS — I10 Essential (primary) hypertension: Secondary | ICD-10-CM | POA: Diagnosis not present

## 2018-08-29 DIAGNOSIS — Z0130 Encounter for examination of blood pressure without abnormal findings: Secondary | ICD-10-CM | POA: Diagnosis not present

## 2018-08-29 DIAGNOSIS — Z79899 Other long term (current) drug therapy: Secondary | ICD-10-CM | POA: Diagnosis not present

## 2018-08-29 DIAGNOSIS — E119 Type 2 diabetes mellitus without complications: Secondary | ICD-10-CM | POA: Diagnosis not present

## 2018-08-29 DIAGNOSIS — E785 Hyperlipidemia, unspecified: Secondary | ICD-10-CM | POA: Diagnosis not present

## 2018-08-29 DIAGNOSIS — E559 Vitamin D deficiency, unspecified: Secondary | ICD-10-CM | POA: Diagnosis not present

## 2018-09-05 DIAGNOSIS — I10 Essential (primary) hypertension: Secondary | ICD-10-CM | POA: Diagnosis not present

## 2018-09-05 DIAGNOSIS — E785 Hyperlipidemia, unspecified: Secondary | ICD-10-CM | POA: Diagnosis not present

## 2018-09-05 DIAGNOSIS — E119 Type 2 diabetes mellitus without complications: Secondary | ICD-10-CM | POA: Diagnosis not present

## 2018-10-03 DIAGNOSIS — E119 Type 2 diabetes mellitus without complications: Secondary | ICD-10-CM | POA: Diagnosis not present

## 2018-10-03 DIAGNOSIS — E785 Hyperlipidemia, unspecified: Secondary | ICD-10-CM | POA: Diagnosis not present

## 2018-10-03 DIAGNOSIS — Z008 Encounter for other general examination: Secondary | ICD-10-CM | POA: Diagnosis not present

## 2018-10-03 DIAGNOSIS — Z719 Counseling, unspecified: Secondary | ICD-10-CM | POA: Diagnosis not present

## 2018-10-06 DIAGNOSIS — Z6825 Body mass index (BMI) 25.0-25.9, adult: Secondary | ICD-10-CM | POA: Diagnosis not present

## 2018-10-06 DIAGNOSIS — E782 Mixed hyperlipidemia: Secondary | ICD-10-CM | POA: Diagnosis not present

## 2018-10-06 DIAGNOSIS — I10 Essential (primary) hypertension: Secondary | ICD-10-CM | POA: Diagnosis not present

## 2018-10-06 DIAGNOSIS — E663 Overweight: Secondary | ICD-10-CM | POA: Diagnosis not present

## 2018-10-06 DIAGNOSIS — E1165 Type 2 diabetes mellitus with hyperglycemia: Secondary | ICD-10-CM | POA: Diagnosis not present

## 2018-10-06 DIAGNOSIS — Z1389 Encounter for screening for other disorder: Secondary | ICD-10-CM | POA: Diagnosis not present

## 2018-10-09 DIAGNOSIS — I10 Essential (primary) hypertension: Secondary | ICD-10-CM | POA: Diagnosis not present

## 2018-10-09 DIAGNOSIS — Z1389 Encounter for screening for other disorder: Secondary | ICD-10-CM | POA: Diagnosis not present

## 2018-10-09 DIAGNOSIS — E1165 Type 2 diabetes mellitus with hyperglycemia: Secondary | ICD-10-CM | POA: Diagnosis not present

## 2018-10-09 DIAGNOSIS — E782 Mixed hyperlipidemia: Secondary | ICD-10-CM | POA: Diagnosis not present

## 2019-03-20 DIAGNOSIS — Z79899 Other long term (current) drug therapy: Secondary | ICD-10-CM | POA: Diagnosis not present

## 2019-03-20 DIAGNOSIS — E1165 Type 2 diabetes mellitus with hyperglycemia: Secondary | ICD-10-CM | POA: Diagnosis not present

## 2019-03-20 DIAGNOSIS — E785 Hyperlipidemia, unspecified: Secondary | ICD-10-CM | POA: Diagnosis not present

## 2019-03-20 DIAGNOSIS — E119 Type 2 diabetes mellitus without complications: Secondary | ICD-10-CM | POA: Diagnosis not present

## 2019-03-20 DIAGNOSIS — Z139 Encounter for screening, unspecified: Secondary | ICD-10-CM | POA: Diagnosis not present

## 2019-03-26 ENCOUNTER — Other Ambulatory Visit: Payer: Self-pay

## 2019-03-26 ENCOUNTER — Other Ambulatory Visit: Payer: Self-pay | Admitting: Internal Medicine

## 2019-03-26 DIAGNOSIS — Z20822 Contact with and (suspected) exposure to covid-19: Secondary | ICD-10-CM

## 2019-03-29 LAB — NOVEL CORONAVIRUS, NAA: SARS-CoV-2, NAA: NOT DETECTED

## 2019-04-03 DIAGNOSIS — E785 Hyperlipidemia, unspecified: Secondary | ICD-10-CM | POA: Diagnosis not present

## 2019-04-03 DIAGNOSIS — D649 Anemia, unspecified: Secondary | ICD-10-CM | POA: Diagnosis not present

## 2019-04-03 DIAGNOSIS — E119 Type 2 diabetes mellitus without complications: Secondary | ICD-10-CM | POA: Diagnosis not present

## 2019-04-03 DIAGNOSIS — I10 Essential (primary) hypertension: Secondary | ICD-10-CM | POA: Diagnosis not present

## 2019-05-01 DIAGNOSIS — D649 Anemia, unspecified: Secondary | ICD-10-CM | POA: Diagnosis not present

## 2019-05-22 DIAGNOSIS — I10 Essential (primary) hypertension: Secondary | ICD-10-CM | POA: Diagnosis not present

## 2019-05-22 DIAGNOSIS — Z6824 Body mass index (BMI) 24.0-24.9, adult: Secondary | ICD-10-CM | POA: Diagnosis not present

## 2019-05-22 DIAGNOSIS — E7849 Other hyperlipidemia: Secondary | ICD-10-CM | POA: Diagnosis not present

## 2019-05-22 DIAGNOSIS — E1165 Type 2 diabetes mellitus with hyperglycemia: Secondary | ICD-10-CM | POA: Diagnosis not present

## 2020-01-08 ENCOUNTER — Encounter (HOSPITAL_COMMUNITY)
Admission: EM | Disposition: A | Discharge status: Home / Self Care | Payer: Self-pay | Source: Home / Self Care | Attending: Emergency Medicine

## 2020-01-08 ENCOUNTER — Emergency Department (HOSPITAL_COMMUNITY): Payer: BC Managed Care – PPO

## 2020-01-08 ENCOUNTER — Ambulatory Visit (HOSPITAL_COMMUNITY)
Admission: EM | Admit: 2020-01-08 | Discharge: 2020-01-08 | Disposition: A | Discharge status: Home / Self Care | Payer: BC Managed Care – PPO | Attending: Orthopedic Surgery | Admitting: Orthopedic Surgery

## 2020-01-08 ENCOUNTER — Emergency Department (HOSPITAL_COMMUNITY): Payer: BC Managed Care – PPO | Admitting: Anesthesiology

## 2020-01-08 ENCOUNTER — Other Ambulatory Visit: Payer: Self-pay

## 2020-01-08 ENCOUNTER — Encounter (HOSPITAL_COMMUNITY): Payer: Self-pay

## 2020-01-08 DIAGNOSIS — Z79899 Other long term (current) drug therapy: Secondary | ICD-10-CM | POA: Insufficient documentation

## 2020-01-08 DIAGNOSIS — S62231B Other displaced fracture of base of first metacarpal bone, right hand, initial encounter for open fracture: Secondary | ICD-10-CM | POA: Diagnosis not present

## 2020-01-08 DIAGNOSIS — Z20822 Contact with and (suspected) exposure to covid-19: Secondary | ICD-10-CM | POA: Insufficient documentation

## 2020-01-08 DIAGNOSIS — X58XXXA Exposure to other specified factors, initial encounter: Secondary | ICD-10-CM | POA: Insufficient documentation

## 2020-01-08 DIAGNOSIS — Y939 Activity, unspecified: Secondary | ICD-10-CM | POA: Insufficient documentation

## 2020-01-08 DIAGNOSIS — E119 Type 2 diabetes mellitus without complications: Secondary | ICD-10-CM | POA: Insufficient documentation

## 2020-01-08 DIAGNOSIS — S6431XA Injury of digital nerve of right thumb, initial encounter: Secondary | ICD-10-CM | POA: Insufficient documentation

## 2020-01-08 DIAGNOSIS — S62511B Displaced fracture of proximal phalanx of right thumb, initial encounter for open fracture: Secondary | ICD-10-CM | POA: Diagnosis not present

## 2020-01-08 DIAGNOSIS — E1165 Type 2 diabetes mellitus with hyperglycemia: Secondary | ICD-10-CM | POA: Diagnosis not present

## 2020-01-08 DIAGNOSIS — S62291B Other fracture of first metacarpal bone, right hand, initial encounter for open fracture: Secondary | ICD-10-CM | POA: Insufficient documentation

## 2020-01-08 DIAGNOSIS — Z03818 Encounter for observation for suspected exposure to other biological agents ruled out: Secondary | ICD-10-CM | POA: Diagnosis not present

## 2020-01-08 DIAGNOSIS — Z7984 Long term (current) use of oral hypoglycemic drugs: Secondary | ICD-10-CM | POA: Insufficient documentation

## 2020-01-08 DIAGNOSIS — Z1389 Encounter for screening for other disorder: Secondary | ICD-10-CM | POA: Diagnosis not present

## 2020-01-08 DIAGNOSIS — E7849 Other hyperlipidemia: Secondary | ICD-10-CM | POA: Diagnosis not present

## 2020-01-08 DIAGNOSIS — S61411A Laceration without foreign body of right hand, initial encounter: Secondary | ICD-10-CM | POA: Diagnosis not present

## 2020-01-08 DIAGNOSIS — E663 Overweight: Secondary | ICD-10-CM | POA: Diagnosis not present

## 2020-01-08 DIAGNOSIS — S62201A Unspecified fracture of first metacarpal bone, right hand, initial encounter for closed fracture: Secondary | ICD-10-CM | POA: Diagnosis not present

## 2020-01-08 DIAGNOSIS — I10 Essential (primary) hypertension: Secondary | ICD-10-CM | POA: Diagnosis not present

## 2020-01-08 DIAGNOSIS — Z0001 Encounter for general adult medical examination with abnormal findings: Secondary | ICD-10-CM | POA: Diagnosis not present

## 2020-01-08 DIAGNOSIS — Z6825 Body mass index (BMI) 25.0-25.9, adult: Secondary | ICD-10-CM | POA: Diagnosis not present

## 2020-01-08 DIAGNOSIS — S62616A Displaced fracture of proximal phalanx of right little finger, initial encounter for closed fracture: Secondary | ICD-10-CM | POA: Diagnosis not present

## 2020-01-08 DIAGNOSIS — S62511A Displaced fracture of proximal phalanx of right thumb, initial encounter for closed fracture: Secondary | ICD-10-CM | POA: Diagnosis not present

## 2020-01-08 HISTORY — PX: NEUROLYSIS OF MEDIAN NERVE: SHX6237

## 2020-01-08 HISTORY — PX: I & D EXTREMITY: SHX5045

## 2020-01-08 HISTORY — PX: PERCUTANEOUS PINNING: SHX2209

## 2020-01-08 LAB — CBC WITH DIFFERENTIAL/PLATELET
Abs Immature Granulocytes: 0.02 10*3/uL (ref 0.00–0.07)
Basophils Absolute: 0 10*3/uL (ref 0.0–0.1)
Basophils Relative: 1 %
Eosinophils Absolute: 0 10*3/uL (ref 0.0–0.5)
Eosinophils Relative: 0 %
HCT: 39.9 % (ref 39.0–52.0)
Hemoglobin: 13.3 g/dL (ref 13.0–17.0)
Immature Granulocytes: 0 %
Lymphocytes Relative: 26 %
Lymphs Abs: 1.8 10*3/uL (ref 0.7–4.0)
MCH: 31.1 pg (ref 26.0–34.0)
MCHC: 33.3 g/dL (ref 30.0–36.0)
MCV: 93.2 fL (ref 80.0–100.0)
Monocytes Absolute: 0.5 10*3/uL (ref 0.1–1.0)
Monocytes Relative: 7 %
Neutro Abs: 4.6 10*3/uL (ref 1.7–7.7)
Neutrophils Relative %: 66 %
Platelets: 164 10*3/uL (ref 150–400)
RBC: 4.28 MIL/uL (ref 4.22–5.81)
RDW: 12.3 % (ref 11.5–15.5)
WBC: 7 10*3/uL (ref 4.0–10.5)
nRBC: 0 % (ref 0.0–0.2)

## 2020-01-08 LAB — BASIC METABOLIC PANEL
Anion gap: 12 (ref 5–15)
BUN: 19 mg/dL (ref 8–23)
CO2: 22 mmol/L (ref 22–32)
Calcium: 9.5 mg/dL (ref 8.9–10.3)
Chloride: 102 mmol/L (ref 98–111)
Creatinine, Ser: 1.14 mg/dL (ref 0.61–1.24)
GFR calc Af Amer: >60 mL/min (ref >60)
GFR calc non Af Amer: >60 mL/min (ref >60)
Glucose, Bld: 195 mg/dL — ABNORMAL HIGH (ref 70–99)
Potassium: 4 mmol/L (ref 3.5–5.1)
Sodium: 136 mmol/L (ref 135–145)

## 2020-01-08 LAB — GLUCOSE, CAPILLARY
Glucose-Capillary: 120 mg/dL — ABNORMAL HIGH (ref 70–99)
Glucose-Capillary: 138 mg/dL — ABNORMAL HIGH (ref 70–99)

## 2020-01-08 LAB — RESPIRATORY PANEL BY RT PCR (FLU A&B, COVID)
Influenza A by PCR: NEGATIVE
Influenza B by PCR: NEGATIVE
SARS Coronavirus 2 by RT PCR: NEGATIVE

## 2020-01-08 SURGERY — IRRIGATION AND DEBRIDEMENT EXTREMITY
Anesthesia: General | Site: Thumb | Laterality: Right

## 2020-01-08 MED ORDER — LIDOCAINE-EPINEPHRINE (PF) 2 %-1:200000 IJ SOLN
10.0000 mL | Freq: Once | INTRAMUSCULAR | Status: DC
  Filled 2020-01-08: qty 10

## 2020-01-08 MED ORDER — PROPOFOL 10 MG/ML IV BOLUS
INTRAVENOUS | Status: AC
  Filled 2020-01-08: qty 20

## 2020-01-08 MED ORDER — BUPIVACAINE HCL (PF) 0.25 % IJ SOLN
INTRAMUSCULAR | Status: DC | PRN
  Administered 2020-01-08: 10 mL

## 2020-01-08 MED ORDER — CEPHALEXIN 500 MG PO CAPS: 500.0000 mg | ORAL_CAPSULE | Freq: Four times a day (QID) | ORAL | 0 refills | Status: AC

## 2020-01-08 MED ORDER — PROPOFOL 10 MG/ML IV BOLUS
INTRAVENOUS | Status: DC | PRN
  Administered 2020-01-08: 160 mg via INTRAVENOUS

## 2020-01-08 MED ORDER — ONDANSETRON HCL 4 MG/2ML IJ SOLN
INTRAMUSCULAR | Status: DC | PRN
  Administered 2020-01-08: 4 mg via INTRAVENOUS

## 2020-01-08 MED ORDER — CEFAZOLIN SODIUM-DEXTROSE 2-3 GM-%(50ML) IV SOLR
INTRAVENOUS | Status: DC | PRN
  Administered 2020-01-08: 2 g via INTRAVENOUS

## 2020-01-08 MED ORDER — FENTANYL CITRATE (PF) 250 MCG/5ML IJ SOLN
INTRAMUSCULAR | Status: AC
  Filled 2020-01-08: qty 5

## 2020-01-08 MED ORDER — MIDAZOLAM HCL 2 MG/2ML IJ SOLN
INTRAMUSCULAR | Status: AC
  Filled 2020-01-08: qty 2

## 2020-01-08 MED ORDER — MIDAZOLAM HCL 2 MG/2ML IJ SOLN
INTRAMUSCULAR | Status: DC | PRN
  Administered 2020-01-08: 2 mg via INTRAVENOUS

## 2020-01-08 MED ORDER — CEFAZOLIN SODIUM-DEXTROSE 2-4 GM/100ML-% IV SOLN
2.0000 g | INTRAVENOUS | Status: AC
  Administered 2020-01-08: 2 g via INTRAVENOUS
  Filled 2020-01-08: qty 100

## 2020-01-08 MED ORDER — BUPIVACAINE HCL (PF) 0.5 % IJ SOLN
10.0000 mL | Freq: Once | INTRAMUSCULAR | Status: DC
  Filled 2020-01-08: qty 30

## 2020-01-08 MED ORDER — OXYCODONE HCL 5 MG PO TABS: 5.0000 mg | ORAL_TABLET | Freq: Once | ORAL | Status: DC | PRN

## 2020-01-08 MED ORDER — LIDOCAINE 2% (20 MG/ML) 5 ML SYRINGE
INTRAMUSCULAR | Status: AC
  Filled 2020-01-08: qty 5

## 2020-01-08 MED ORDER — SUGAMMADEX SODIUM 200 MG/2ML IV SOLN
INTRAVENOUS | Status: DC | PRN
  Administered 2020-01-08: 200 mg via INTRAVENOUS

## 2020-01-08 MED ORDER — OXYCODONE HCL 5 MG PO TABS: 5.0000 mg | ORAL_TABLET | ORAL | 0 refills | Status: AC | PRN

## 2020-01-08 MED ORDER — TETANUS-DIPHTH-ACELL PERTUSSIS 5-2.5-18.5 LF-MCG/0.5 IM SUSP
0.5000 mL | Freq: Once | INTRAMUSCULAR | Status: AC
  Administered 2020-01-08: 0.5 mL via INTRAMUSCULAR
  Filled 2020-01-08: qty 0.5

## 2020-01-08 MED ORDER — ACETAMINOPHEN 160 MG/5ML PO SOLN: 1000.0000 mg | Freq: Once | ORAL | Status: DC | PRN

## 2020-01-08 MED ORDER — ROCURONIUM BROMIDE 10 MG/ML (PF) SYRINGE
PREFILLED_SYRINGE | INTRAVENOUS | Status: AC
  Filled 2020-01-08: qty 10

## 2020-01-08 MED ORDER — HYDROMORPHONE HCL 1 MG/ML IJ SOLN: 0.2500 mg | INTRAMUSCULAR | Status: DC | PRN

## 2020-01-08 MED ORDER — ACETAMINOPHEN 500 MG PO TABS: 1000.0000 mg | ORAL_TABLET | Freq: Once | ORAL | Status: DC | PRN

## 2020-01-08 MED ORDER — LACTATED RINGERS IV SOLN: INTRAVENOUS | Status: DC

## 2020-01-08 MED ORDER — OXYCODONE HCL 5 MG/5ML PO SOLN: 5.0000 mg | Freq: Once | ORAL | Status: DC | PRN

## 2020-01-08 MED ORDER — ROCURONIUM BROMIDE 100 MG/10ML IV SOLN
INTRAVENOUS | Status: DC | PRN
  Administered 2020-01-08: 70 mg via INTRAVENOUS

## 2020-01-08 MED ORDER — ACETAMINOPHEN 10 MG/ML IV SOLN: 1000.0000 mg | Freq: Once | INTRAVENOUS | Status: DC | PRN

## 2020-01-08 MED ORDER — CEFAZOLIN SODIUM-DEXTROSE 2-4 GM/100ML-% IV SOLN
INTRAVENOUS | Status: AC
  Filled 2020-01-08: qty 100

## 2020-01-08 MED ORDER — FENTANYL CITRATE (PF) 250 MCG/5ML IJ SOLN
INTRAMUSCULAR | Status: DC | PRN
  Administered 2020-01-08: 100 ug via INTRAVENOUS

## 2020-01-08 MED ORDER — SODIUM CHLORIDE 0.9 % IR SOLN
Status: DC | PRN
  Administered 2020-01-08: 3000 mL

## 2020-01-08 SURGICAL SUPPLY — 59 items
BENZOIN TINCTURE PRP APPL 2/3 (GAUZE/BANDAGES/DRESSINGS) IMPLANT
BLADE CLIPPER SURG (BLADE) IMPLANT
BNDG CONFORM 2 STRL LF (GAUZE/BANDAGES/DRESSINGS) IMPLANT
BNDG ELASTIC 3X5.8 VLCR STR LF (GAUZE/BANDAGES/DRESSINGS) ×3 IMPLANT
BNDG ELASTIC 4X5.8 VLCR STR LF (GAUZE/BANDAGES/DRESSINGS) ×3 IMPLANT
BNDG GAUZE ELAST 4 BULKY (GAUZE/BANDAGES/DRESSINGS) ×7 IMPLANT
CORD BIPOLAR FORCEPS 12FT (ELECTRODE) ×3 IMPLANT
COVER SURGICAL LIGHT HANDLE (MISCELLANEOUS) ×3 IMPLANT
COVER WAND RF STERILE (DRAPES) ×3 IMPLANT
CUFF TOURN SGL QUICK 18X4 (TOURNIQUET CUFF) ×3 IMPLANT
CUFF TOURN SGL QUICK 24 (TOURNIQUET CUFF)
CUFF TRNQT CYL 24X4X16.5-23 (TOURNIQUET CUFF) IMPLANT
DRSG ADAPTIC 3X8 NADH LF (GAUZE/BANDAGES/DRESSINGS) ×3 IMPLANT
DRSG EMULSION OIL 3X3 NADH (GAUZE/BANDAGES/DRESSINGS) IMPLANT
DRSG XEROFORM 1X8 (GAUZE/BANDAGES/DRESSINGS) ×1 IMPLANT
GAUZE SPONGE 4X4 12PLY STRL (GAUZE/BANDAGES/DRESSINGS) ×3 IMPLANT
GAUZE XEROFORM 1X8 LF (GAUZE/BANDAGES/DRESSINGS) ×3 IMPLANT
GLOVE BIOGEL M 8.0 STRL (GLOVE) ×3 IMPLANT
GLOVE SS BIOGEL STRL SZ 8 (GLOVE) ×2 IMPLANT
GLOVE SUPERSENSE BIOGEL SZ 8 (GLOVE) ×1
GOWN STRL REUS W/ TWL LRG LVL3 (GOWN DISPOSABLE) ×4 IMPLANT
GOWN STRL REUS W/ TWL XL LVL3 (GOWN DISPOSABLE) ×4 IMPLANT
GOWN STRL REUS W/TWL LRG LVL3 (GOWN DISPOSABLE) ×4
GOWN STRL REUS W/TWL XL LVL3 (GOWN DISPOSABLE) ×2
K-WIRE DBL 4X.28 (WIRE) ×6
KIT BASIN OR (CUSTOM PROCEDURE TRAY) ×3 IMPLANT
KIT TURNOVER KIT B (KITS) ×3 IMPLANT
KWIRE DBL 4X.28 (WIRE) IMPLANT
MANIFOLD NEPTUNE II (INSTRUMENTS) ×3 IMPLANT
NDL HYPO 25GX1X1/2 BEV (NEEDLE) IMPLANT
NEEDLE HYPO 25GX1X1/2 BEV (NEEDLE) IMPLANT
NS IRRIG 1000ML POUR BTL (IV SOLUTION) ×3 IMPLANT
PACK ORTHO EXTREMITY (CUSTOM PROCEDURE TRAY) ×3 IMPLANT
PAD ARMBOARD 7.5X6 YLW CONV (MISCELLANEOUS) ×6 IMPLANT
PAD CAST 3X4 CTTN HI CHSV (CAST SUPPLIES) IMPLANT
PAD CAST 4YDX4 CTTN HI CHSV (CAST SUPPLIES) ×2 IMPLANT
PADDING CAST COTTON 3X4 STRL (CAST SUPPLIES) ×2
PADDING CAST COTTON 4X4 STRL (CAST SUPPLIES) ×3
PADDING CAST SYNTHETIC 4 (CAST SUPPLIES) ×1
PADDING CAST SYNTHETIC 4X4 STR (CAST SUPPLIES) IMPLANT
PILLOW ARM CARTER ADULT (MISCELLANEOUS) ×1 IMPLANT
SET CYSTO W/LG BORE CLAMP LF (SET/KITS/TRAYS/PACK) ×3 IMPLANT
SLING ARM FOAM STRAP LRG (SOFTGOODS) ×1 IMPLANT
SOL PREP POV-IOD 4OZ 10% (MISCELLANEOUS) ×6 IMPLANT
SPONGE LAP 4X18 RFD (DISPOSABLE) ×3 IMPLANT
STRIP CLOSURE SKIN 1/2X4 (GAUZE/BANDAGES/DRESSINGS) IMPLANT
SUT CHROMIC 4 0 P 3 18 (SUTURE) ×1 IMPLANT
SUT ETHILON 4 0 P 3 18 (SUTURE) IMPLANT
SUT ETHILON 5 0 P 3 18 (SUTURE)
SUT NYLON ETHILON 5-0 P-3 1X18 (SUTURE) IMPLANT
SUT PROLENE 4 0 P 3 18 (SUTURE) IMPLANT
SUT PROLENE 4 0 PS 2 18 (SUTURE) ×2 IMPLANT
SWAB CULTURE ESWAB REG 1ML (MISCELLANEOUS) IMPLANT
SYR CONTROL 10ML LL (SYRINGE) IMPLANT
TOWEL GREEN STERILE (TOWEL DISPOSABLE) ×3 IMPLANT
TOWEL GREEN STERILE FF (TOWEL DISPOSABLE) ×3 IMPLANT
TUBE CONNECTING 12X1/4 (SUCTIONS) ×3 IMPLANT
WATER STERILE IRR 1000ML POUR (IV SOLUTION) ×3 IMPLANT
YANKAUER SUCT BULB TIP NO VENT (SUCTIONS) ×3 IMPLANT

## 2020-01-08 NOTE — Anesthesia Procedure Notes (Signed)
Procedure Name: Intubation Date/Time: 01/08/2020 8:31 PM Performed by: Valetta Fuller, CRNA Pre-anesthesia Checklist: Patient identified, Emergency Drugs available, Suction available and Patient being monitored Patient Re-evaluated:Patient Re-evaluated prior to induction Oxygen Delivery Method: Circle system utilized Preoxygenation: Pre-oxygenation with 100% oxygen Induction Type: IV induction Ventilation: Mask ventilation without difficulty Laryngoscope Size: Miller and 2 Grade View: Grade II Tube type: Oral Tube size: 7.5 mm Number of attempts: 1 Airway Equipment and Method: Stylet Placement Confirmation: ETT inserted through vocal cords under direct vision,  positive ETCO2 and breath sounds checked- equal and bilateral Secured at: 23 cm Tube secured with: Tape Dental Injury: Teeth and Oropharynx as per pre-operative assessment

## 2020-01-08 NOTE — Transfer of Care (Signed)
Immediate Anesthesia Transfer of Care Note  Patient: Jose Peters  Procedure(s) Performed: IRRIGATION AND DEBRIDEMENT Right/Hand Thumb (Right Thumb) Open Repair Fracture Right Hand/Thumb (Right Thumb)  Patient Location: PACU  Anesthesia Type:General  Level of Consciousness: awake, alert  and oriented  Airway & Oxygen Therapy: Patient Spontanous Breathing  Post-op Assessment: Report given to RN and Post -op Vital signs reviewed and stable  Post vital signs: Reviewed and stable  Last Vitals:  Vitals Value Taken Time  BP 146/78 01/08/20 2139  Temp 36.3 C 01/08/20 2139  Pulse 94 01/08/20 2143  Resp 16 01/08/20 2143  SpO2 100 % 01/08/20 2143  Vitals shown include unvalidated device data.  Last Pain:  Vitals:   01/08/20 2139  TempSrc:   PainSc: 0-No pain         Complications: No apparent anesthesia complications

## 2020-01-08 NOTE — Op Note (Signed)
Operative note January 08, 2020  Roseanne Kaufman MD.  Preoperative diagnosis webspace laceration right thumb with open proximal phalanx and metacarpal fracture.  Postop diagnosis the same  Surgical procedure #1 irrigation debridement open fracture excisional debridement nature of skin subtenons tissue bone tendon and associated soft tissue structures.  This was an excisional debridement.  #2 open reduction internal fixation proximal phalanx fracture right thumb #3 open treatment metacarpal fracture right thumb #4 neuroplasty radial and ulnar digital nerve right thumb #5 stress radiography/5 view radiographic series performed examined and interpreted by myself right thumb  Surgeon [place] blood loss minimal  Tourniquet time less than an hour.  Operative procedure in detail this patient was injured and went to [facility] he was referred for evaluation treatment and reconstructive surgical attempts.  The patient was taken to the operative theater underwent a general anesthetic was prepped and draped in usual sterile fashion with Hibiclens scrub followed by Betadine scrub and paint.  Following this he underwent careful and cautious irrigation to the extremity.  Timeout was observed of course and sterile field secured.  The operation commenced with inflation of the tourniquet followed by irrigation debridement of skin subtenons tissue tendon and soft tissue including bone.  This was an excisional debridement with curette knife and scissor 4 cm in length.  Following this we placed 3 L of fluid through and through the area without difficulty.  I removed some particle would not pieces and make sure the debridement was excellent.  Following this we then performed a neuroplasty of the radial digital nerve and ulnar digital nerves of the thumb they were intact.  The patient had radial digital artery involvement but had excellent refill to the thumb.  Tendon sheath  was injured but there is no tendon laceration on exploration.  Following this we then performed open treatment of the metacarpal fracture with simple excision of small fragment.  Following this we then performed a closed reduction and pinning of the proximal phalanx fracture.  This very comminuted fracture and we simply performed pinning with 2 crossing K wires.  X-rays looked excellent on 5 view radiographic series now was quite pleased this.  Following this I then very carefully and cautiously performed final copy x-rays.  Once this was complete we then performed a careful and cautious closure of the wound with chromic suture.  This was an irrigation debridement open treatment of metacarpal and proximal phalanx fracture and neuroplasty of the digital nerves.  The patient tolerated this well and there were no complicating features.  He will be discharged home on Keflex x14 days oxycodone as needed pain and return to see [place] in 14 days for follow-up.  All instructions were given to the family.  Regina Coppolino MD

## 2020-01-08 NOTE — ED Provider Notes (Signed)
[facility] EMERGENCY DEPARTMENT Provider Note   CSN: [id number] Arrival date & time: 01/08/20  1431     History Chief Complaint  Patient presents with  . Laceration    Jose Peters is a 59 y.o. male.  HPI      Jose KUCHENBECKER is a 59 y.o. male, with a history of DM and HTN, presenting to the ED with injury to the right hand that occurred shortly prior to arrival.  Patient was using a vertical hydraulic wood splitter.  He had his right hand resting on top of the log as the hydraulic press was rising, the splitting maul was stuck in the wood, and his right hand became pressed between the top of the lock and the metal guard at the top of the press. His pain is minimal. Denies numbness, weakness, wrist injury, other injuries.   Past Medical History:  Diagnosis Date  . Diabetes mellitus   . Hypertension     There are no problems to display for this patient.   Past Surgical History:  Procedure Laterality Date  . COLONOSCOPY  12/08/2011   Procedure: COLONOSCOPY;  Surgeon: Daneil Dolin, MD;  Location: [facility];  Service: Endoscopy;  Laterality: N/A;  9:15 AM       No family history on file.  Social History   Tobacco Use  . Smoking status: Never Smoker  . Smokeless tobacco: Never Used  Substance Use Topics  . Alcohol use: No  . Drug use: No    Home Medications Prior to Admission medications   Medication Sig Start Date End Date Taking? Authorizing Provider  amLODipine (NORVASC) 10 MG tablet Take 10 mg by mouth daily. 12/18/19  Yes [provider]  escitalopram (LEXAPRO) 20 MG tablet Take 20 mg by mouth daily. 11/04/19  Yes [provider]  JANUMET 50-1000 MG tablet Take 1 tablet by mouth 2 (two) times daily. 01/08/20  Yes [provider]  losartan (COZAAR) 100 MG tablet Take 100 mg by mouth daily. 11/04/19  Yes [provider]  Multiple Vitamin (MULITIVITAMIN WITH MINERALS) TABS Take 1 tablet by mouth daily.   Yes [provider]  OZEMPIC, 1 MG/DOSE, 2 MG/1.5ML SOPN Inject 0.75 mLs into the skin every Sunday. 01/08/20  Yes [provider]  simvastatin (ZOCOR) 40 MG tablet Take 40 mg by mouth every evening.   Yes [provider]    Allergies    Patient has no known allergies.  Review of Systems   Review of Systems  Musculoskeletal: Positive for arthralgias.  Skin: Positive for wound.  Neurological: Negative for weakness and numbness.  All other systems reviewed and are negative.   Physical Exam Updated Vital Signs BP 130/77 (BP Location: Left Arm)   Pulse 91   Temp 98.2 F (36.8 C) (Oral)   Resp 18   Ht 6' (1.829 m)   Wt 87.5 kg   SpO2 97%   BMI 26.18 kg/m   Physical Exam Vitals and nursing note reviewed.  Constitutional:      General: He is not in acute distress.    Appearance: He is well-developed. He is not diaphoretic.  HENT:     Head: Normocephalic and atraumatic.     Mouth/Throat:     Mouth: Mucous membranes are moist.     Pharynx: Oropharynx is clear.  Eyes:     Conjunctiva/sclera: Conjunctivae normal.  Cardiovascular:     Rate and Rhythm: Normal rate and regular rhythm.  Pulses:          Radial pulses are 2+ on the right side and 2+ on the left side.  Pulmonary:     Effort: Pulmonary effort is normal.  Abdominal:     Tenderness: There is no guarding.  Musculoskeletal:     Cervical back: Neck supple.     Comments: 5 cm, jagged laceration to the right palmar hand extending into the thumb.  There is noted exposed tendon inside the wound. He does not have any noted range of motion deficit in the right thumb. Scattered abrasions to other areas of the hand, but without pain, tenderness, or swelling. No pain or tenderness in the right wrist.  No pain with range of motion of the right wrist.  Skin:    General: Skin is warm and dry.     Capillary Refill: Capillary refill takes less than 2 seconds.     Coloration: Skin is not pale.  Neurological:      Mental Status: He is alert.     Comments: Sensation to light touch grossly intact throughout each of the fingers and the right hand. Flexion and extension intact with each of the fingers of the right hand.  Psychiatric:        Behavior: Behavior normal.                  ED Results / Procedures / Treatments   Labs (all labs ordered are listed, but only abnormal results are displayed) Labs Reviewed  RESPIRATORY PANEL BY RT PCR (FLU A&B, COVID)  CBC WITH DIFFERENTIAL/PLATELET  BASIC METABOLIC PANEL    EKG None  Radiology DG Hand Complete Right  Addendum Date: 01/08/2020   ADDENDUM REPORT: 01/08/2020 16:30 ADDENDUM: Voice recognition error: The first sentence of the impression section should read "Comminuted fracture of the proximal phalanx of the first digit. " Electronically Signed   By: San Morelle M.D.   On: 01/08/2020 16:30   Result Date: 01/08/2020 CLINICAL DATA:  Trauma to right hand with wood splitter. EXAM: RIGHT HAND - COMPLETE 3+ VIEW COMPARISON:  None. FINDINGS: Comminuted fracture is present in the proximal phalanx without significant displacement. Extensive soft tissue injury is evident. No radiopaque foreign body is present. Minimal fracture is present at the head of the first metacarpal IMPRESSION: 1. Comminuted fracture of the proximal phalanx of the fifth digit. 2. Minimal fracture at the head of the first metacarpal. Electronically Signed: By: San Morelle M.D. On: 01/08/2020 15:29    Procedures .Critical Care Performed by: Lorayne Bender, PA-C Authorized by: Lorayne Bender, PA-C   Critical care provider statement:    Critical care time (minutes):  35   Critical care time was exclusive of:  Separately billable procedures and treating other patients   Critical care was necessary to treat or prevent imminent or life-threatening deterioration of the following conditions: Open fracture requiring surgical intervention.   Critical care was time  spent personally by me on the following activities:  Ordering and review of laboratory studies, ordering and performing treatments and interventions, ordering and review of radiographic studies, re-evaluation of patient's condition, obtaining history from patient or surrogate, discussions with consultants, development of treatment plan with patient or surrogate and examination of patient   I assumed direction of critical care for this patient from another provider in my specialty: no     (including critical care time)  Medications Ordered in ED Medications  lidocaine-EPINEPHrine (XYLOCAINE W/EPI) 2 %-1:200000 (PF) injection 10 mL (  has no administration in time range)  bupivacaine (MARCAINE) 0.5 % injection 10 mL (has no administration in time range)  Tdap (BOOSTRIX) injection 0.5 mL (0.5 mLs Intramuscular Given 01/08/20 1617)  ceFAZolin (ANCEF) IVPB 2g/100 mL premix (0 g Intravenous Stopped 01/08/20 1755)    ED Course  I have reviewed the triage vital signs and the nursing notes.  Pertinent labs & imaging results that were available during my care of the patient were reviewed by me and considered in my medical decision making (see chart for details).  Clinical Course as of Jan 07 1821  Tue Jan 08, 2020  1612 Suspect the read for this xray should say comminuted fracture of the proximal phalanx of the first digit rather than of the fifth digit.  Called radiology reading room to address this.  Dr. Jobe Igo is unavailable, however, they will send him a message regarding this.  DG Hand Complete Right [SJ]  [id number] Spoke with Dr. Amedeo Plenty, hand surgeon.  Requests patient go to short stay at [facility].  2-hour Covid, CBC, BMP.  Do not hold the patient for blood work.  Patient can go POV.  He may keep his IV in place.   [SJ]  1800 Katharine Look, RN tells me she called over to [facility] Stay and was told patient would need an EKG before he leaves [facility] ED and that he can not go directly to Short Stay  and would need to go to the ED at [facility] instead.   [SJ]  1805 Spoke with Dr. Amedeo Plenty again for clarification. He is in the OR in the middle of a case.  Speak with Caren Griffins [phone] and see if we can't sort out patient coming directly to Short Stay.   [SJ]  [id number] with Caren Griffins at [facility]. She spoke with [place]. They state patient can come directly to Short Stay.   [SJ]  [id number] Dr. Amedeo Plenty updated.   [SJ]    Clinical Course User Index [SJ] Rollin Kotowski, Helane Gunther, PA-C   MDM Rules/Calculators/A&P                      Patient presents with a right hand injury.  No evidence of neurovascular compromise. I personally reviewed and interpreted the patient's imaging study. Comminuted fracture to the right first proximal phalanx, which qualifies as an open fracture due to the overlying wound. Patient transferred POV to [facility] for surgery.  Findings and plan of care discussed with Milton Ferguson, MD.   Final Clinical Impression(s) / ED Diagnoses Final diagnoses:  Laceration of right hand without foreign body, initial encounter    Rx / DC Orders ED Discharge Orders    None       Layla Maw 01/08/20 Saddie Benders, MD 01/09/20 1009

## 2020-01-08 NOTE — Discharge Instructions (Addendum)
Please elevate your hand.  You may move your fingers but please keep your thumb still.  Your operation went very well.  We put pins in your finger to stabilize the bone.  Please call us for any problems otherwise would like to see you in the office in 14 days.  Please call for that appointment.  We have phoned in an antibiotic to take for 14 days as well as pain medicine for you.  Keep your bandage clean and dry and do not remove your bandage or get it wet.  We recommend that you to take vitamin C 1000 mg a day to promote healing. We also recommend that if you require  pain medicine that you take a stool softener to prevent constipation as most pain medicines will have constipation side effects. We recommend either Peri-Colace or Senokot and recommend that you also consider adding MiraLAX as well to prevent the constipation affects from pain medicine if you are required to use them. These medicines are over the counter and may be purchased at a local pharmacy. A cup of yogurt and a probiotic can also be helpful during the recovery process as the medicines can disrupt your intestinal environment. Keep bandage clean and dry.  Call for any problems.  No smoking.  Criteria for driving a car: you should be off your pain medicine for 7-8 hours, able to drive one handed(confident), thinking clearly and feeling able in your judgement to drive. Continue elevation as it will decrease swelling.  If instructed by MD move your fingers within the confines of the bandage/splint.  Use ice if instructed by your MD. Call immediately for any sudden loss of feeling in your hand/arm or change in functional abilities of the extremity.

## 2020-01-08 NOTE — Anesthesia Preprocedure Evaluation (Addendum)
Anesthesia Evaluation  Patient identified by MRN, date of birth, ID band Patient awake    Reviewed: Allergy & Precautions, NPO status , Patient's Chart, lab work & pertinent test results  History of Anesthesia Complications Negative for: history of anesthetic complications  Airway Mallampati: III  TM Distance: >3 FB Neck ROM: Full    Dental  (+) Dental Advisory Given, Teeth Intact   Pulmonary neg pulmonary ROS, neg recent URI,    breath sounds clear to auscultation       Cardiovascular hypertension, Pt. on medications (-) angina(-) Past MI and (-) CHF (-) dysrhythmias  Rhythm:Regular     Neuro/Psych negative neurological ROS  negative psych ROS   GI/Hepatic   Endo/Other  diabetes, Type 2  Renal/GU      Musculoskeletal   Abdominal   Peds  Hematology negative hematology ROS (+)   Anesthesia Other Findings   Reproductive/Obstetrics                            Anesthesia Physical Anesthesia Plan  ASA: II  Anesthesia Plan: General   Post-op Pain Management:    Induction: Intravenous  PONV Risk Score and Plan: 2 and Ondansetron and Dexamethasone  Airway Management Planned: Oral ETT  Additional Equipment: None  Intra-op Plan:   Post-operative Plan: Extubation in OR  Informed Consent: I have reviewed the patients History and Physical, chart, labs and discussed the procedure including the risks, benefits and alternatives for the proposed anesthesia with the patient or authorized representative who has indicated his/her understanding and acceptance.     Dental advisory given  Plan Discussed with: CRNA and Surgeon  Anesthesia Plan Comments:         Anesthesia Quick Evaluation

## 2020-01-08 NOTE — H&P (Signed)
Jose Peters is an 59 y.o. male.   Chief Complaint: Open fracture right thumb proximal phalanx and metacarpal with soft tissue injury volar and at the first webspace HPI: Patient presents for acute surgical evaluation treatment he was seen [facility] and transferred for further care.  He has an open fracture about his right thumb with proximal phalanx and metacarpal head involvement.  He has a webspace laceration quite severe.  I have counseled him in regards to risk and benefits of surgery.  He is here with his wife.  They are very nice people.Patient presents for evaluation and treatment of the of their upper extremity predicament. The patient denies neck, back, chest or  abdominal pain. The patient notes that they have no lower extremity problems. The patients primary complaint is noted. We are planning surgical care pathway for the upper extremity.  Past Medical History:  Diagnosis Date  . Diabetes mellitus   . Hypertension     Past Surgical History:  Procedure Laterality Date  . COLONOSCOPY  12/08/2011   Procedure: COLONOSCOPY;  Surgeon: Daneil Dolin, MD;  [facility];  Service: Endoscopy;  Laterality: N/A;  9:15 AM    No family history on file. Social History:  reports that he has never smoked. He has never used smokeless tobacco. He reports that he does not drink alcohol or use drugs.  Allergies: No Known Allergies  Medications Prior to Admission  Medication Sig Dispense Refill  . amLODipine (NORVASC) 10 MG tablet Take 10 mg by mouth daily.    Marland Kitchen escitalopram (LEXAPRO) 20 MG tablet Take 20 mg by mouth daily.    Marland Kitchen JANUMET 50-1000 MG tablet Take 1 tablet by mouth 2 (two) times daily.    Marland Kitchen losartan (COZAAR) 100 MG tablet Take 100 mg by mouth daily.    . Multiple Vitamin (MULITIVITAMIN WITH MINERALS) TABS Take 1 tablet by mouth daily.    Marland Kitchen OZEMPIC, 1 MG/DOSE, 2 MG/1.5ML SOPN Inject 0.75 mLs into the skin every Sunday.    . simvastatin (ZOCOR) 40 MG tablet  Take 40 mg by mouth every evening.      Results for orders placed or performed during the hospital encounter of 01/08/20 (from the past 48 hour(s))  Respiratory Panel by RT PCR (Flu A&B, Covid) - Nasopharyngeal Swab     Status: None   Collection Time: 01/08/20  5:14 PM   Specimen: Nasopharyngeal Swab  Result Value Ref Range   SARS Coronavirus 2 by RT PCR NEGATIVE NEGATIVE    Comment: (NOTE) SARS-CoV-2 target nucleic acids are NOT DETECTED. The SARS-CoV-2 RNA is generally detectable in upper respiratoy specimens during the acute phase of infection. The lowest concentration of SARS-CoV-2 viral copies this assay can detect is 131 copies/mL. A negative result does not preclude SARS-Cov-2 infection and should not be used as the sole basis for treatment or other patient management decisions. A negative result may occur with  improper specimen collection/handling, submission of specimen other than nasopharyngeal swab, presence of viral mutation(s) within the areas targeted by this assay, and inadequate number of viral copies (<131 copies/mL). A negative result must be combined with clinical observations, patient history, and epidemiological information. The expected result is Negative. Fact Sheet for Patients:  PinkCheek.be Fact Sheet for Healthcare Providers:  GravelBags.it This test is not yet ap proved or cleared by the [place] FDA and  has been authorized for detection and/or diagnosis of SARS-CoV-2 by FDA under an Emergency Use Authorization (EUA). This EUA  will remain  in effect (meaning this test can be used) for the duration of the COVID-19 declaration under Section 564(b)(1) of the Act, 21 U.S.C. section 360bbb-3(b)(1), unless the authorization is terminated or revoked sooner.    Influenza A by PCR NEGATIVE NEGATIVE   Influenza B by PCR NEGATIVE NEGATIVE    Comment: (NOTE) The Xpert Xpress SARS-CoV-2/FLU/RSV assay  is intended as an aid in  the diagnosis of influenza from Nasopharyngeal swab specimens and  should not be used as a sole basis for treatment. Nasal washings and  aspirates are unacceptable for Xpert Xpress SARS-CoV-2/FLU/RSV  testing. Fact Sheet for Patients: PinkCheek.be Fact Sheet for Healthcare Providers: GravelBags.it This test is not yet approved or cleared by the [place] FDA and  has been authorized for detection and/or diagnosis of SARS-CoV-2 by  FDA under an Emergency Use Authorization (EUA). This EUA will remain  in effect (meaning this test can be used) for the duration of the  Covid-19 declaration under Section 564(b)(1) of the Act, 21  U.S.C. section 360bbb-3(b)(1), unless the authorization is  terminated or revoked. Performed at [facility], [address] [place], [place] [id number]   Basic metabolic panel     Status: Abnormal   Collection Time: 01/08/20  5:29 PM  Result Value Ref Range   Sodium 136 135 - 145 mmol/L   Potassium 4.0 3.5 - 5.1 mmol/L   Chloride 102 98 - 111 mmol/L   CO2 22 22 - 32 mmol/L   Glucose, Bld 195 (H) 70 - 99 mg/dL    Comment: Glucose reference range applies only to samples taken after fasting for at least 8 hours.   BUN 19 8 - 23 mg/dL   Creatinine, Ser 1.14 0.61 - 1.24 mg/dL   Calcium 9.5 8.9 - 10.3 mg/dL   GFR calc non Af Amer >60 >60 mL/min   GFR calc Af Amer >60 >60 mL/min   Anion gap 12 5 - 15    Comment: Performed at [facility], [address], [place], [place] [address]  CBC with Differential     Status: None   Collection Time: 01/08/20  5:29 PM  Result Value Ref Range   WBC 7.0 4.0 - 10.5 K/uL   RBC 4.28 4.22 - 5.81 MIL/uL   Hemoglobin 13.3 13.0 - 17.0 g/dL   HCT 39.9 39.0 - 52.0 %   MCV 93.2 80.0 - 100.0 fL   MCH 31.1 26.0 - 34.0 pg   MCHC 33.3 30.0 - 36.0 g/dL   RDW 12.3 11.5 - 15.5 %   Platelets 164 150 - 400 K/uL   nRBC 0.0 0.0 - 0.2 %   Neutrophils  Relative % 66 %   Neutro Abs 4.6 1.7 - 7.7 K/uL   Lymphocytes Relative 26 %   Lymphs Abs 1.8 0.7 - 4.0 K/uL   Monocytes Relative 7 %   Monocytes Absolute 0.5 0.1 - 1.0 K/uL   Eosinophils Relative 0 %   Eosinophils Absolute 0.0 0.0 - 0.5 K/uL   Basophils Relative 1 %   Basophils Absolute 0.0 0.0 - 0.1 K/uL   Immature Granulocytes 0 %   Abs Immature Granulocytes 0.02 0.00 - 0.07 K/uL    Comment: Performed at [facility], [address] [place], [place] [address]   DG Hand Complete Right  Addendum Date: 01/08/2020   ADDENDUM REPORT: 01/08/2020 16:30 ADDENDUM: Voice recognition error: The first sentence of the impression section should read "Comminuted fracture of the proximal phalanx of the first  digit. " Electronically Signed   By: San Morelle M.D.   On: 01/08/2020 16:30   Result Date: 01/08/2020 CLINICAL DATA:  Trauma to right hand with wood splitter. EXAM: RIGHT HAND - COMPLETE 3+ VIEW COMPARISON:  None. FINDINGS: Comminuted fracture is present in the proximal phalanx without significant displacement. Extensive soft tissue injury is evident. No radiopaque foreign body is present. Minimal fracture is present at the head of the first metacarpal IMPRESSION: 1. Comminuted fracture of the proximal phalanx of the fifth digit. 2. Minimal fracture at the head of the first metacarpal. Electronically Signed: By: San Morelle M.D. On: 01/08/2020 15:29    Review of Systems  Respiratory: Negative.   Cardiovascular: Negative.   Genitourinary: Negative.     Blood pressure 113/70, pulse 78, temperature 98.6 F (37 C), resp. rate 20, height 6' (1.829 m), weight 87.5 kg, SpO2 99 %. Physical Exam  Comminuted complex open fracture about the proximal phalanx with metacarpal head fracture notable as well.  First webspace laceration.  This is an open fracture type II in nature about the web space in my opinion  Neurovascular exam is difficult due to his pain response.  I reviewed this with  him at length.  The patient is alert and oriented in no acute distress. The patient complains of pain in the affected upper extremity.  The patient is noted to have a normal HEENT exam. Lung fields show equal chest expansion and no shortness of breath. Abdomen exam is nontender without distention. Lower extremity examination does not show any fracture dislocation or blood clot symptoms. Pelvis is stable and the neck and back are stable and nontender. Assessment/Plan Open first webspace laceration with proximal phalanx and metacarpal fracture.  We will plan for irrigation debridement repair reconstruction is necessary.  Given the comminuted nature I will likely plan for pinning type external fixation regime based upon her parameters in the operative theater and what we have to work with.  He understands this.  We are planning surgery for your upper extremity. The risk and benefits of surgery to include risk of bleeding, infection, anesthesia,  damage to normal structures and failure of the surgery to accomplish its intended goals of relieving symptoms and restoring function have been discussed in detail. With this in mind we plan to proceed. I have specifically discussed with the patient the pre-and postoperative regime and the dos and don'ts and risk and benefits in great detail. Risk and benefits of surgery also include risk of dystrophy(CRPS), chronic nerve pain, failure of the healing process to go onto completion and other inherent risks of surgery The relavent the pathophysiology of the disease/injury process, as well as the alternatives for treatment and postoperative course of action has been discussed in great detail with the patient who desires to proceed.  We will do everything in our power to help you (the patient) restore function to the upper extremity. It is a pleasure to see this patient today.   Willa Frater III, MD 01/08/2020, 7:15 PM

## 2020-01-08 NOTE — ED Triage Notes (Signed)
Pt presents to ED with laceration to right thumb and palm of right hand from wood splitter.

## 2020-01-08 NOTE — ED Notes (Signed)
Patient advised not to eat or drink anything pending surgery. advised to go to [facility] short stay. Per Arlean Hopping PA he has spoken with the charge nurse in short stay and they will be expecting him.

## 2020-01-09 NOTE — Anesthesia Postprocedure Evaluation (Signed)
Anesthesia Post Note  Patient: DUSTN GARDOCKI  Procedure(s) Performed: IRRIGATION AND DEBRIDEMENT Right/Hand Thumb (Right Thumb) Open Repair Fracture Right Hand/Thumb (Right Thumb) Neuroplasty Repair of complex laceration (Right )     Patient location during evaluation: PACU Anesthesia Type: General Level of consciousness: awake and alert Pain management: pain level controlled Vital Signs Assessment: post-procedure vital signs reviewed and stable Respiratory status: spontaneous breathing, nonlabored ventilation, respiratory function stable and patient connected to nasal cannula oxygen Cardiovascular status: blood pressure returned to baseline and stable Postop Assessment: no apparent nausea or vomiting Anesthetic complications: no    Last Vitals:  Vitals:   01/08/20 2200 01/08/20 2215  BP: 139/81 126/76  Pulse: 86 83  Resp: (!) 9 11  Temp:  (!) 36.3 C  SpO2: 97% 98%    Last Pain:  Vitals:   01/08/20 2200  TempSrc:   PainSc: 0-No pain                 Laniece Hornbaker

## 2020-01-23 DIAGNOSIS — S62511D Displaced fracture of proximal phalanx of right thumb, subsequent encounter for fracture with routine healing: Secondary | ICD-10-CM | POA: Diagnosis not present

## 2020-01-23 DIAGNOSIS — Z4789 Encounter for other orthopedic aftercare: Secondary | ICD-10-CM | POA: Diagnosis not present

## 2020-01-23 DIAGNOSIS — M79644 Pain in right finger(s): Secondary | ICD-10-CM | POA: Diagnosis not present

## 2020-01-23 DIAGNOSIS — S62513B Displaced fracture of proximal phalanx of unspecified thumb, initial encounter for open fracture: Secondary | ICD-10-CM | POA: Insufficient documentation

## 2020-01-29 DIAGNOSIS — K2950 Unspecified chronic gastritis without bleeding: Secondary | ICD-10-CM | POA: Diagnosis not present

## 2020-01-29 DIAGNOSIS — R1310 Dysphagia, unspecified: Secondary | ICD-10-CM | POA: Diagnosis not present

## 2020-02-05 DIAGNOSIS — R7989 Other specified abnormal findings of blood chemistry: Secondary | ICD-10-CM | POA: Diagnosis not present

## 2020-02-11 DIAGNOSIS — M79644 Pain in right finger(s): Secondary | ICD-10-CM | POA: Diagnosis not present

## 2020-02-11 DIAGNOSIS — S62511D Displaced fracture of proximal phalanx of right thumb, subsequent encounter for fracture with routine healing: Secondary | ICD-10-CM | POA: Diagnosis not present

## 2020-02-14 DIAGNOSIS — M79644 Pain in right finger(s): Secondary | ICD-10-CM | POA: Diagnosis not present

## 2020-02-26 DIAGNOSIS — R7989 Other specified abnormal findings of blood chemistry: Secondary | ICD-10-CM | POA: Diagnosis not present

## 2020-02-27 DIAGNOSIS — M25531 Pain in right wrist: Secondary | ICD-10-CM | POA: Diagnosis not present

## 2020-02-27 DIAGNOSIS — S62511D Displaced fracture of proximal phalanx of right thumb, subsequent encounter for fracture with routine healing: Secondary | ICD-10-CM | POA: Diagnosis not present

## 2020-03-12 DIAGNOSIS — M79644 Pain in right finger(s): Secondary | ICD-10-CM | POA: Diagnosis not present

## 2020-03-25 DIAGNOSIS — M79644 Pain in right finger(s): Secondary | ICD-10-CM | POA: Diagnosis not present

## 2020-03-28 DIAGNOSIS — E291 Testicular hypofunction: Secondary | ICD-10-CM | POA: Diagnosis not present

## 2020-04-14 DIAGNOSIS — M79644 Pain in right finger(s): Secondary | ICD-10-CM | POA: Diagnosis not present

## 2020-04-14 DIAGNOSIS — S62511D Displaced fracture of proximal phalanx of right thumb, subsequent encounter for fracture with routine healing: Secondary | ICD-10-CM | POA: Diagnosis not present

## 2020-04-14 DIAGNOSIS — Z4789 Encounter for other orthopedic aftercare: Secondary | ICD-10-CM | POA: Diagnosis not present

## 2020-04-21 DIAGNOSIS — M79644 Pain in right finger(s): Secondary | ICD-10-CM | POA: Diagnosis not present

## 2020-05-01 DIAGNOSIS — R7989 Other specified abnormal findings of blood chemistry: Secondary | ICD-10-CM | POA: Diagnosis not present

## 2020-05-28 DIAGNOSIS — E291 Testicular hypofunction: Secondary | ICD-10-CM | POA: Diagnosis not present

## 2020-06-09 DIAGNOSIS — S62511D Displaced fracture of proximal phalanx of right thumb, subsequent encounter for fracture with routine healing: Secondary | ICD-10-CM | POA: Diagnosis not present

## 2020-06-30 DIAGNOSIS — R7989 Other specified abnormal findings of blood chemistry: Secondary | ICD-10-CM | POA: Diagnosis not present

## 2020-07-08 DIAGNOSIS — Z23 Encounter for immunization: Secondary | ICD-10-CM | POA: Diagnosis not present

## 2020-08-18 DIAGNOSIS — R7989 Other specified abnormal findings of blood chemistry: Secondary | ICD-10-CM | POA: Diagnosis not present

## 2020-10-01 DIAGNOSIS — R7989 Other specified abnormal findings of blood chemistry: Secondary | ICD-10-CM | POA: Diagnosis not present

## 2020-10-09 DIAGNOSIS — E7849 Other hyperlipidemia: Secondary | ICD-10-CM | POA: Diagnosis not present

## 2020-10-09 DIAGNOSIS — Z1331 Encounter for screening for depression: Secondary | ICD-10-CM | POA: Diagnosis not present

## 2020-10-09 DIAGNOSIS — E1165 Type 2 diabetes mellitus with hyperglycemia: Secondary | ICD-10-CM | POA: Diagnosis not present

## 2020-10-09 DIAGNOSIS — I10 Essential (primary) hypertension: Secondary | ICD-10-CM | POA: Diagnosis not present

## 2020-10-09 DIAGNOSIS — Z6825 Body mass index (BMI) 25.0-25.9, adult: Secondary | ICD-10-CM | POA: Diagnosis not present

## 2020-11-17 DIAGNOSIS — R7989 Other specified abnormal findings of blood chemistry: Secondary | ICD-10-CM | POA: Diagnosis not present

## 2021-01-27 DIAGNOSIS — R7989 Other specified abnormal findings of blood chemistry: Secondary | ICD-10-CM | POA: Diagnosis not present

## 2021-02-17 DIAGNOSIS — F330 Major depressive disorder, recurrent, mild: Secondary | ICD-10-CM | POA: Diagnosis not present

## 2021-02-17 DIAGNOSIS — E1140 Type 2 diabetes mellitus with diabetic neuropathy, unspecified: Secondary | ICD-10-CM | POA: Diagnosis not present

## 2021-02-17 DIAGNOSIS — Z0001 Encounter for general adult medical examination with abnormal findings: Secondary | ICD-10-CM | POA: Diagnosis not present

## 2021-02-17 DIAGNOSIS — Z1331 Encounter for screening for depression: Secondary | ICD-10-CM | POA: Diagnosis not present

## 2021-02-17 DIAGNOSIS — I10 Essential (primary) hypertension: Secondary | ICD-10-CM | POA: Diagnosis not present

## 2021-02-17 DIAGNOSIS — E7849 Other hyperlipidemia: Secondary | ICD-10-CM | POA: Diagnosis not present

## 2021-02-17 DIAGNOSIS — N401 Enlarged prostate with lower urinary tract symptoms: Secondary | ICD-10-CM | POA: Diagnosis not present

## 2021-02-17 DIAGNOSIS — E1165 Type 2 diabetes mellitus with hyperglycemia: Secondary | ICD-10-CM | POA: Diagnosis not present

## 2021-03-25 DIAGNOSIS — E291 Testicular hypofunction: Secondary | ICD-10-CM | POA: Diagnosis not present

## 2021-07-08 DIAGNOSIS — R1011 Right upper quadrant pain: Secondary | ICD-10-CM | POA: Diagnosis not present

## 2021-07-08 DIAGNOSIS — N401 Enlarged prostate with lower urinary tract symptoms: Secondary | ICD-10-CM | POA: Diagnosis not present

## 2021-07-08 DIAGNOSIS — Z23 Encounter for immunization: Secondary | ICD-10-CM | POA: Diagnosis not present

## 2021-07-08 DIAGNOSIS — E663 Overweight: Secondary | ICD-10-CM | POA: Diagnosis not present

## 2021-07-08 DIAGNOSIS — E291 Testicular hypofunction: Secondary | ICD-10-CM | POA: Diagnosis not present

## 2021-07-08 DIAGNOSIS — E1140 Type 2 diabetes mellitus with diabetic neuropathy, unspecified: Secondary | ICD-10-CM | POA: Diagnosis not present

## 2021-07-08 DIAGNOSIS — Z6825 Body mass index (BMI) 25.0-25.9, adult: Secondary | ICD-10-CM | POA: Diagnosis not present

## 2021-07-08 DIAGNOSIS — E7849 Other hyperlipidemia: Secondary | ICD-10-CM | POA: Diagnosis not present

## 2021-07-08 DIAGNOSIS — I10 Essential (primary) hypertension: Secondary | ICD-10-CM | POA: Diagnosis not present

## 2021-07-16 DIAGNOSIS — K869 Disease of pancreas, unspecified: Secondary | ICD-10-CM | POA: Diagnosis not present

## 2021-07-16 DIAGNOSIS — K5900 Constipation, unspecified: Secondary | ICD-10-CM | POA: Diagnosis not present

## 2021-07-16 DIAGNOSIS — R160 Hepatomegaly, not elsewhere classified: Secondary | ICD-10-CM | POA: Diagnosis not present

## 2021-07-16 DIAGNOSIS — K753 Granulomatous hepatitis, not elsewhere classified: Secondary | ICD-10-CM | POA: Diagnosis not present

## 2021-07-20 DIAGNOSIS — K8689 Other specified diseases of pancreas: Secondary | ICD-10-CM | POA: Diagnosis not present

## 2021-07-28 ENCOUNTER — Telehealth: Payer: Self-pay | Admitting: Gastroenterology

## 2021-07-28 NOTE — Telephone Encounter (Signed)
Received a paper referral about this patient.  Patient with history of hypertension, hyperlipidemia, diabetes, BPH.  Patient evaluated in October 2022.  Patient has had abdominal recurrent pain x2 months without emesis or bowel movement changes.  He was noted to "be up-to-date on colonoscopy.  No GERD symptoms.  No hematochezia or melena or hematemesis was noted.    For the right upper quadrant abdominal pain, the patient was ordered for a CT abdomen pelvis with contrast.  07/16/2021 CT abdomen pelvis with IV contrast done at [facility]  Liver small calcified granulomas.  Borderline enlarged.  Focal fatty infiltration about the falciform ligament. Gallbladder distended gallbladder without inflammation.  No biliary dilation. Spleen normal. Pancreas partially calcified enhancing mass about the uncinate process of the pancreas measuring 1.1 cm. Adrenal glands normal. Kidneys no perinephric stranding or hydronephrosis.  Right renal cyst and too small to characterize bilateral hypodensities.  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 Lymphadenopathy none Fluid no free fluid or fluid collection. Other fat-containing umbilical hernia. Small bowel grossly unremarkable. : Large amount retained stool.  No active inflammation.  Appendix appears unremarkable. Fluid no free fluid or fluid collection. Lymphadenopathy none. Vascular no significant abnormality. Other decompressed urinary bladder is unremarkable.  Prostamegaly with indention of the base of the bladder. Degenerative changes and scoliosis.  No fracture or suspicious lesion.  Minimal retrolithiasis of L4 on L5. Impression No acute process. Partially calcified enhancing 1.1 cm mass involving the uncinate process of the pancreas.  Though nonspecific, this is suspicious for neuroendocrine tumor. Large amount retained colonic stool. Additional findings as detailed above.  Based on the findings of this patient's CT  scan (unfortunately we cannot review since it was done at [facility]) the patient does meet criteria for consideration of EGD/EUS with evaluation of the pancreas for potential fine-needle biopsy/fine-needle aspiration.  Patty, please reach out to the patient and see if he is willing to move forward with EGD/EUS as recommended/referred by his PCP. If so he can be scheduled with DJ or myself next available. We will work on getting the CT scan image CD uploaded into the chart so that DJ or I can review (whoever is doing the procedure). Looking at his medications there is no evidence of any blood thinners.  Please update the referring providers at [facility], Dr. Gerarda Fraction.

## 2021-07-28 NOTE — Telephone Encounter (Signed)
Jose Peters, Also obtain any labs that have been done within the last few months (not clear if any were done based on the paper chart). Thanks. GM

## 2021-07-29 NOTE — Telephone Encounter (Signed)
Left message on machine to call back  

## 2021-07-30 ENCOUNTER — Other Ambulatory Visit: Payer: Self-pay

## 2021-07-30 DIAGNOSIS — R948 Abnormal results of function studies of other organs and systems: Secondary | ICD-10-CM

## 2021-07-30 NOTE — Telephone Encounter (Signed)
The pt has been scheduled for EGD EUS on 09/10/21 at 11 am at [facility] with DJ Left message on machine to call back

## 2021-07-30 NOTE — Telephone Encounter (Signed)
Left message on machine to call back  

## 2021-07-30 NOTE — Telephone Encounter (Signed)
EUS scheduled, pt instructed and medications reviewed.  Patient instructions mailed to home.  Patient to call with any questions or concerns. The pt has a copy of the CT scan on disk as well as labs.  He will bring those by the office and drop them off at her earliest convenience.

## 2021-08-31 ENCOUNTER — Encounter (HOSPITAL_COMMUNITY): Payer: Self-pay | Admitting: Gastroenterology

## 2021-08-31 NOTE — Progress Notes (Signed)
Attempted to obtain medical history via telephone, unable to reach at this time. I left a voicemail to return pre surgical testing department's phone call.  

## 2021-09-03 DIAGNOSIS — E291 Testicular hypofunction: Secondary | ICD-10-CM | POA: Diagnosis not present

## 2021-09-10 ENCOUNTER — Ambulatory Visit (HOSPITAL_COMMUNITY)
Admission: RE | Admit: 2021-09-10 | Discharge: 2021-09-10 | Disposition: A | Discharge status: Home / Self Care | Payer: BC Managed Care – PPO | Attending: Gastroenterology | Admitting: Gastroenterology

## 2021-09-10 ENCOUNTER — Ambulatory Visit (HOSPITAL_COMMUNITY): Payer: BC Managed Care – PPO | Admitting: Anesthesiology

## 2021-09-10 ENCOUNTER — Encounter (HOSPITAL_COMMUNITY)
Admission: RE | Disposition: A | Discharge status: Home / Self Care | Payer: Self-pay | Source: Home / Self Care | Attending: Gastroenterology

## 2021-09-10 ENCOUNTER — Other Ambulatory Visit: Payer: Self-pay

## 2021-09-10 ENCOUNTER — Encounter (HOSPITAL_COMMUNITY): Payer: Self-pay | Admitting: Gastroenterology

## 2021-09-10 DIAGNOSIS — K8689 Other specified diseases of pancreas: Secondary | ICD-10-CM | POA: Diagnosis not present

## 2021-09-10 DIAGNOSIS — I10 Essential (primary) hypertension: Secondary | ICD-10-CM | POA: Diagnosis not present

## 2021-09-10 DIAGNOSIS — E119 Type 2 diabetes mellitus without complications: Secondary | ICD-10-CM | POA: Diagnosis not present

## 2021-09-10 DIAGNOSIS — D378 Neoplasm of uncertain behavior of other specified digestive organs: Secondary | ICD-10-CM | POA: Diagnosis not present

## 2021-09-10 DIAGNOSIS — R948 Abnormal results of function studies of other organs and systems: Secondary | ICD-10-CM

## 2021-09-10 DIAGNOSIS — R935 Abnormal findings on diagnostic imaging of other abdominal regions, including retroperitoneum: Secondary | ICD-10-CM | POA: Diagnosis not present

## 2021-09-10 HISTORY — PX: EUS: SHX5427

## 2021-09-10 HISTORY — PX: ESOPHAGOGASTRODUODENOSCOPY (EGD) WITH PROPOFOL: SHX5813

## 2021-09-10 HISTORY — PX: FINE NEEDLE ASPIRATION: SHX5430

## 2021-09-10 LAB — GLUCOSE, CAPILLARY: Glucose-Capillary: 174 mg/dL — ABNORMAL HIGH (ref 70–99)

## 2021-09-10 SURGERY — UPPER ENDOSCOPIC ULTRASOUND (EUS) RADIAL
Anesthesia: Monitor Anesthesia Care

## 2021-09-10 MED ORDER — PROPOFOL 500 MG/50ML IV EMUL
INTRAVENOUS | Status: DC | PRN
  Administered 2021-09-10: 100 ug/kg/min via INTRAVENOUS

## 2021-09-10 MED ORDER — PROPOFOL 10 MG/ML IV BOLUS
INTRAVENOUS | Status: DC | PRN
  Administered 2021-09-10 (×2): 20 mg via INTRAVENOUS

## 2021-09-10 MED ORDER — LACTATED RINGERS IV SOLN: INTRAVENOUS | Status: DC

## 2021-09-10 MED ORDER — SODIUM CHLORIDE 0.9 % IV SOLN: INTRAVENOUS | Status: DC

## 2021-09-10 MED ORDER — DEXMEDETOMIDINE (PRECEDEX) IN NS 20 MCG/5ML (4 MCG/ML) IV SYRINGE
PREFILLED_SYRINGE | INTRAVENOUS | Status: DC | PRN
  Administered 2021-09-10 (×4): 4 ug via INTRAVENOUS

## 2021-09-10 SURGICAL SUPPLY — 15 items

## 2021-09-10 NOTE — H&P (Signed)
°  HPI: This is a man referred by Dr. Gerarda Fraction.  GI care elsewhere, unkown  CT scan in [facility] system 07/2021 (unable to view those images) sugtgested 1.1cm mass in pancreas uncinate   ROS: complete GI ROS as described in HPI, all other review negative.  Constitutional:  No unintentional weight loss   Past Medical History:  Diagnosis Date   Diabetes mellitus    Hypertension     Past Surgical History:  Procedure Laterality Date   COLONOSCOPY  12/08/2011   Procedure: COLONOSCOPY;  Surgeon: Daneil Dolin, MD;  [facility];  Service: Endoscopy;  Laterality: N/A;  9:15 AM   I & D EXTREMITY Right 01/08/2020   Procedure: IRRIGATION AND DEBRIDEMENT Right/Hand Thumb;  Surgeon: Roseanne Kaufman, MD;  Location: [place];  Service: Orthopedics;  Laterality: Right;   NEUROLYSIS OF MEDIAN NERVE Right 01/08/2020   Procedure: Neuroplasty Repair of complex laceration;  Surgeon: Roseanne Kaufman, MD;  Location: [place];  Service: Orthopedics;  Laterality: Right;   PERCUTANEOUS PINNING Right 01/08/2020   Procedure: Open Repair Fracture Right Hand/Thumb;  Surgeon: Roseanne Kaufman, MD;  Location: [place];  Service: Orthopedics;  Laterality: Right;    Current Facility-Administered Medications  Medication Dose Route Frequency Provider Last Rate Last Admin   0.9 %  sodium chloride infusion   Intravenous Continuous Milus Banister, MD       lactated ringers infusion   Intravenous Continuous Milus Banister, MD        Allergies as of 07/30/2021   (No Known Allergies)    History reviewed. No pertinent family history.  Social History   Socioeconomic History   Marital status: Married    Spouse name: Not on file   Number of children: Not on file   Years of education: Not on file   Highest education level: Not on file  Occupational History   Not on file  Tobacco Use   Smoking status: Never   Smokeless tobacco: Never  Substance and Sexual Activity   Alcohol use: No   Drug use: No   Sexual  activity: Not on file  Other Topics Concern   Not on file  Social History Narrative   Not on file   Social Determinants of Health   Financial Resource Strain: Not on file  Food Insecurity: Not on file  Transportation Needs: Not on file  Physical Activity: Not on file  Stress: Not on file  Social Connections: Not on file  Intimate Partner Violence: Not on file     Physical Exam: BP (!) 155/84    Pulse 79    Temp 98.5 F (36.9 C)    Resp 15    Ht 6' (1.829 m)    Wt 84.4 kg    SpO2 100%    BMI 25.23 kg/m  Constitutional: generally well-appearing Psychiatric: alert and oriented x3 Abdomen: soft, nontender, nondistended, no obvious ascites, no peritoneal signs, normal bowel sounds No peripheral edema noted in lower extremities  Assessment and plan: 60 y.o. male with abnormal pancreas  For upper EUS evaluation today  Please see the "Patient Instructions" section for addition details about the plan.  Owens Loffler, MD [facility] 09/10/2021, 7:31 AM

## 2021-09-10 NOTE — Anesthesia Postprocedure Evaluation (Signed)
Anesthesia Post Note  Patient: Jose Peters  Procedure(s) Performed: UPPER ENDOSCOPIC ULTRASOUND (EUS) RADIAL ESOPHAGOGASTRODUODENOSCOPY (EGD) WITH PROPOFOL FINE NEEDLE ASPIRATION (FNA) LINEAR     Patient location during evaluation: PACU Anesthesia Type: MAC Level of consciousness: awake and alert Pain management: pain level controlled Vital Signs Assessment: post-procedure vital signs reviewed and stable Respiratory status: spontaneous breathing, nonlabored ventilation, respiratory function stable and patient connected to nasal cannula oxygen Cardiovascular status: stable and blood pressure returned to baseline Postop Assessment: no apparent nausea or vomiting Anesthetic complications: no   No notable events documented.  Last Vitals:  Vitals:   09/10/21 0920 09/10/21 0925  BP: 122/80 124/80  Pulse: 69 75  Resp: 16 13  Temp:    SpO2: 99% 100%    Last Pain:  Vitals:   09/10/21 0915  TempSrc:   PainSc: 0-No pain                 Marenda Accardi

## 2021-09-10 NOTE — Anesthesia Preprocedure Evaluation (Addendum)
Anesthesia Evaluation  Patient identified by MRN, date of birth, ID band Patient awake    Reviewed: Allergy & Precautions, NPO status , Patient's Chart, lab work & pertinent test results  History of Anesthesia Complications Negative for: history of anesthetic complications  Airway Mallampati: II  TM Distance: >3 FB Neck ROM: Full    Dental  (+) Dental Advisory Given, Teeth Intact, Missing   Pulmonary neg pulmonary ROS, neg recent URI,    breath sounds clear to auscultation       Cardiovascular hypertension, Pt. on medications (-) angina(-) Past MI and (-) CHF (-) dysrhythmias  Rhythm:Regular     Neuro/Psych negative neurological ROS  negative psych ROS   GI/Hepatic   Endo/Other  diabetes, Type 2  Renal/GU      Musculoskeletal   Abdominal   Peds  Hematology negative hematology ROS (+)   Anesthesia Other Findings   Reproductive/Obstetrics                            Anesthesia Physical  Anesthesia Plan  ASA: 3  Anesthesia Plan: MAC   Post-op Pain Management:    Induction: Intravenous  PONV Risk Score and Plan: 2 and Ondansetron and Dexamethasone  Airway Management Planned: Natural Airway and Nasal Cannula  Additional Equipment: None  Intra-op Plan:   Post-operative Plan: Extubation in OR  Informed Consent: I have reviewed the patients History and Physical, chart, labs and discussed the procedure including the risks, benefits and alternatives for the proposed anesthesia with the patient or authorized representative who has indicated his/her understanding and acceptance.     Dental advisory given  Plan Discussed with: CRNA, Surgeon and Anesthesiologist  Anesthesia Plan Comments:         Anesthesia Quick Evaluation

## 2021-09-10 NOTE — Transfer of Care (Signed)
Immediate Anesthesia Transfer of Care Note  Patient: Jose Peters  Procedure(s) Performed: UPPER ENDOSCOPIC ULTRASOUND (EUS) RADIAL ESOPHAGOGASTRODUODENOSCOPY (EGD) WITH PROPOFOL FINE NEEDLE ASPIRATION (FNA) LINEAR  Patient Location: Endoscopy Unit  Anesthesia Type:MAC  Level of Consciousness: drowsy  Airway & Oxygen Therapy: Patient Spontanous Breathing and Patient connected to face mask oxygen  Post-op Assessment: Report given to RN and Post -op Vital signs reviewed and stable  Post vital signs: Reviewed and stable  Last Vitals:  Vitals Value Taken Time  BP    Temp    Pulse 70 09/10/21 0906  Resp 18 09/10/21 0906  SpO2 100 % 09/10/21 0906  Vitals shown include unvalidated device data.  Last Pain:  Vitals:   09/10/21 0717  TempSrc: Oral  PainSc: 8          Complications: No notable events documented.

## 2021-09-10 NOTE — Anesthesia Procedure Notes (Signed)
Procedure Name: MAC Date/Time: 09/10/2021 8:39 AM Performed by: Lieutenant Diego, CRNA Pre-anesthesia Checklist: Patient identified, Emergency Drugs available, Suction available, Patient being monitored and Timeout performed Patient Re-evaluated:Patient Re-evaluated prior to induction Oxygen Delivery Method: Simple face mask Preoxygenation: Pre-oxygenation with 100% oxygen Induction Type: IV induction

## 2021-09-10 NOTE — Op Note (Signed)
[facility] Patient Name: Jose Peters Procedure Date: 09/10/2021 MRN: [id number] Attending MD: Milus Banister , MD Date of Birth: September 03, 1961 CSN: [id number] Age: 60 Admit Type: Outpatient Procedure:                Upper EUS Indications:              Right sided abd pains led to CT scan in [facility]                            system. I am unable to view the images however the                            report reads "Partially calcified enhancing 1.1 cm                            mass involving the uncinate process of the                            pancreas. Though nonspecific, this is suspicious                            for a neuroendocrine tumor." Providers:                Milus Banister, MD, Elmer Ramp. Tilden Dome, RN, Dietitian, Technician Referring MD:             Redmond School, MD Medicines:                Monitored Anesthesia Care Complications:            No immediate complications. Estimated blood loss:                            None. Estimated Blood Loss:     Estimated blood loss: none. Procedure:                Pre-Anesthesia Assessment:                           - Prior to the procedure, a History and Physical                            was performed, and patient medications and                            allergies were reviewed. The patient's tolerance of                            previous anesthesia was also reviewed. The risks                            and benefits of the procedure and the sedation  options and risks were discussed with the patient.                            All questions were answered, and informed consent                            was obtained. Prior Anticoagulants: The patient has                            taken no previous anticoagulant or antiplatelet                            agents. ASA Grade Assessment: II - A patient with                            mild systemic  disease. After reviewing the risks                            and benefits, the patient was deemed in                            satisfactory condition to undergo the procedure.                           After obtaining informed consent, the endoscope was                            passed under direct vision. Throughout the                            procedure, the patient's blood pressure, pulse, and                            oxygen saturations were monitored continuously. The                            GF-UE190-AL5 ([id number]) Olympus radial ultrasound                            scope was introduced through the mouth, and                            advanced to the second part of duodenum. The                            [id number] ([id number]) Olympus linear ultrasound scope                            was introduced through the mouth, and advanced to                            the second part of duodenum. The upper EUS was  accomplished without difficulty. The patient                            tolerated the procedure well. Scope In: Scope Out: Findings:      ENDOSCOPIC FINDING (with radial and linear echoendoscopes): :      The examined esophagus was endoscopically normal.      The entire examined stomach was endoscopically normal.      The examined duodenum was endoscopically normal.      ENDOSONOGRAPHIC FINDING: :      1. 1.2cm round, clearly demarcated hypoechoic, partially calcified mass       in/near the pancreas uncinate. This seems to communicate with the       muscularis propria layer of the duodenal wall. The portal vein is       immediately deep to the mass, rendering FNa more technically       challenging, risky. I sampled the mass with two transduodenal EUS FNA       passes with a 25 guage needle using color Doppler.      2. Pancreatic parenchyma was otherwise normal.      3. No peripancreatic adenopathy.      4. CBD and main pancreatic duct were both  normal, non-dilated.      5. Gallbladder was normal appearing. Impression:               - 1.2cm, round, hypoechoic mass involving the                            pancreatic uncinate, perhaps wall of the duodenum.                            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                           - Await final cytology results. He will likely need                            referral to a pancreatic surgeon pending these                            results.                           - It is not clear if this lesion is causing any of                            his right sided abdominal pains. Moderate Sedation:      Not Applicable - Patient had care per Anesthesia. Recommendation:           - Discharge patient to home. Procedure Code(s):        --- Professional ---                           [id number], Esophagogastroduodenoscopy, flexible,  transoral; with transendoscopic ultrasound-guided                            intramural or transmural fine needle                            aspiration/biopsy(s), (includes endoscopic                            ultrasound examination limited to the esophagus,                            stomach or duodenum, and adjacent structures) Diagnosis Code(s):        --- Professional ---                           K86.89, Other specified diseases of pancreas                           R93.3, Abnormal findings on diagnostic imaging of                            other parts of digestive tract CPT copyright 2019 American Medical Association. All rights reserved. The codes documented in this report are preliminary and upon coder review may  be revised to meet current compliance requirements. Milus Banister, MD 09/10/2021 9:29:19 AM This report has been signed electronically. Number of Addenda: 0

## 2021-09-11 LAB — CYTOLOGY - NON PAP

## 2021-09-23 DIAGNOSIS — J069 Acute upper respiratory infection, unspecified: Secondary | ICD-10-CM | POA: Diagnosis not present

## 2021-09-30 ENCOUNTER — Telehealth: Payer: Self-pay | Admitting: Gastroenterology

## 2021-09-30 NOTE — Telephone Encounter (Signed)
Dr. Ardis Hughs referred patient to [facility] and they need referral from [place] before they will schedule him.  He's been waiting to get this done for three weeks.

## 2021-09-30 NOTE — Telephone Encounter (Signed)
I called and spoke with CCS and the pt has an appt with Dr Zenia Resides on 10/07/21 at 40 am.  The scheduler Lattie Haw will call the pt.

## 2021-10-07 DIAGNOSIS — D3A8 Other benign neuroendocrine tumors: Secondary | ICD-10-CM | POA: Insufficient documentation

## 2021-10-08 DIAGNOSIS — S39011A Strain of muscle, fascia and tendon of abdomen, initial encounter: Secondary | ICD-10-CM | POA: Diagnosis not present

## 2021-10-08 DIAGNOSIS — E1165 Type 2 diabetes mellitus with hyperglycemia: Secondary | ICD-10-CM | POA: Diagnosis not present

## 2021-10-08 DIAGNOSIS — Z6824 Body mass index (BMI) 24.0-24.9, adult: Secondary | ICD-10-CM | POA: Diagnosis not present

## 2021-10-08 DIAGNOSIS — E663 Overweight: Secondary | ICD-10-CM | POA: Diagnosis not present

## 2021-10-22 ENCOUNTER — Ambulatory Visit
Admission: RE | Admit: 2021-10-22 | Discharge: 2021-10-22 | Disposition: A | Discharge status: Home / Self Care | Payer: Self-pay | Source: Ambulatory Visit | Attending: Surgery | Admitting: Surgery

## 2021-10-22 DIAGNOSIS — K8689 Other specified diseases of pancreas: Secondary | ICD-10-CM

## 2021-10-27 ENCOUNTER — Other Ambulatory Visit (HOSPITAL_COMMUNITY): Payer: Self-pay | Admitting: Surgery

## 2021-10-27 ENCOUNTER — Inpatient Hospital Stay
Admission: RE | Admit: 2021-10-27 | Discharge: 2021-10-27 | Disposition: A | Discharge status: Home / Self Care | Payer: Self-pay | Source: Ambulatory Visit | Attending: Surgery | Admitting: Surgery

## 2021-10-27 DIAGNOSIS — K920 Hematemesis: Secondary | ICD-10-CM

## 2021-10-28 ENCOUNTER — Other Ambulatory Visit: Payer: Self-pay

## 2021-10-28 NOTE — Progress Notes (Signed)
The proposed treatment discussed in conference is for discussion purpose only and is not a binding recommendation.  The patients have not been physically examined, or presented with their treatment options.  Therefore, final treatment plans cannot be decided.  

## 2021-11-03 DIAGNOSIS — D3A8 Other benign neuroendocrine tumors: Secondary | ICD-10-CM | POA: Diagnosis not present

## 2021-11-03 DIAGNOSIS — R109 Unspecified abdominal pain: Secondary | ICD-10-CM | POA: Diagnosis not present

## 2021-11-23 ENCOUNTER — Telehealth: Payer: Self-pay | Admitting: Gastroenterology

## 2021-11-23 NOTE — Telephone Encounter (Signed)
Patients wife called wanting to follow up on the patients next treatment plan said she has not heard from anyone since January.

## 2021-11-23 NOTE — Telephone Encounter (Signed)
I spoke with the pt's wife and advised that she needs to call Dr Ayesha Rumpf office and discuss what the treatment plans are.  I see that he was seen on 2/7 and was advised to follow up with that office in 4 months with CT prior. She will call that office to get clarification.

## 2021-12-03 ENCOUNTER — Encounter: Payer: Self-pay | Admitting: *Deleted

## 2021-12-10 ENCOUNTER — Telehealth: Payer: Self-pay | Admitting: *Deleted

## 2021-12-10 NOTE — Telephone Encounter (Signed)
Spoke with pt's wife.  She informed me that pt unable to take calls at work.  Received questionnaire back.  Pt has currently been under the care of [facility].  She said he had not seen them in office before.  Wants to continue care in [place].  Can we take pt back here?  ?

## 2021-12-14 ENCOUNTER — Telehealth: Payer: Self-pay | Admitting: Internal Medicine

## 2021-12-14 NOTE — Telephone Encounter (Signed)
Pt called to say his wife dropped off his questionnaire form and was calling to schedule his colonoscopy. Please advise. Pt said he would call back this afternoon.  ?

## 2021-12-14 NOTE — Telephone Encounter (Signed)
Pt called back and I told him that the triage nurse was going to discuss with office manager and she would get back with him. Pt agreed and asked to call him after 4 pm at [phone] ?

## 2021-12-15 NOTE — Telephone Encounter (Signed)
Noted  

## 2021-12-22 ENCOUNTER — Other Ambulatory Visit: Payer: Self-pay

## 2021-12-22 ENCOUNTER — Ambulatory Visit (INDEPENDENT_AMBULATORY_CARE_PROVIDER_SITE_OTHER): Payer: Self-pay | Admitting: *Deleted

## 2021-12-22 VITALS — Ht 72.0 in | Wt 186.0 lb

## 2021-12-22 DIAGNOSIS — Z1211 Encounter for screening for malignant neoplasm of colon: Secondary | ICD-10-CM

## 2021-12-22 NOTE — Progress Notes (Signed)
Ok to schedule with propofol. ASA 2.  ?Day of prep: janumet take am dose only. ?AM of TCS: hold janumet ?HOLD Ozempic for one week before TCS. ?

## 2021-12-22 NOTE — Progress Notes (Addendum)
Gastroenterology Pre-Procedure Review ? ?Request Date: 12/22/2021 ?Requesting Physician: 10 year recall, Last TCS 12/08/2011 by Dr. Gala Romney, normal colon ? ?PATIENT REVIEW QUESTIONS: The patient responded to the following health history questions as indicated:   ? ?1. Diabetes Melitis: yes, type II ?2. Joint replacements in the past 12 months: no ?3. Major health problems in the past 3 months: no ?4. Has an artificial valve or MVP: no ?5. Has a defibrillator: no ?6. Has been advised in past to take antibiotics in advance of a procedure like teeth cleaning: no ?7. Family history of colon cancer: no  ?8. Alcohol Use: no ?9. Illicit drug Use: no ?10. History of sleep apnea: no  ?11. History of coronary artery or other vascular stents placed within the last 12 months: no ?12. History of any prior anesthesia complications: no ?13. Body mass index is 25.23 kg/m?. ?   ?MEDICATIONS & ALLERGIES:    ?Patient reports the following regarding taking any blood thinners:   ?Plavix? no ?Aspirin? no ?Coumadin? no ?Brilinta? no ?Xarelto? no ?Eliquis? no ?Pradaxa? no ?Savaysa? no ?Effient? no ? ?Patient confirms/reports the following medications:  ?Current Outpatient Medications  ?Medication Sig Dispense Refill  ? amLODipine (NORVASC) 10 MG tablet Take 10 mg by mouth in the morning.    ? escitalopram (LEXAPRO) 20 MG tablet Take 20 mg by mouth in the morning.    ? JANUMET 50-1000 MG tablet Take 1 tablet by mouth 2 (two) times daily.    ? losartan (COZAAR) 100 MG tablet Take 100 mg by mouth every evening.    ? Multiple Vitamin (MULITIVITAMIN WITH MINERALS) TABS Take 1 tablet by mouth daily.    ? OZEMPIC, 1 MG/DOSE, 4 MG/3ML SOPN Inject 1 mg into the skin every Sunday.    ? simvastatin (ZOCOR) 40 MG tablet Take 40 mg by mouth in the morning.    ? tamsulosin (FLOMAX) 0.4 MG CAPS capsule Take 0.4 mg by mouth in the morning.    ? ?No current facility-administered medications for this visit.  ? ? ?Patient confirms/reports the following  allergies:  ?No Known Allergies ? ?No orders of the defined types were placed in this encounter. ? ? ?AUTHORIZATION INFORMATION ?Primary Insurance: [place],  [place] #: [id number],  Group #: [id number] ?Pre-Cert / Josem Kaufmann required: No, not required ? ?SCHEDULE INFORMATION: ?Procedure has been scheduled as follows:  ?Date: 01/25/2022, Time: 11:15 ?Location: [facility] with Dr. Gala Romney ? ?This Gastroenterology Pre-Precedure Review Form is being routed to the following provider(s): Neil Crouch, PA-C ?  ?

## 2021-12-22 NOTE — Progress Notes (Signed)
Spoke to pt.  Will call once May procedure schedules are available since he needs morning procedure due to being diabetic. ?

## 2021-12-29 ENCOUNTER — Telehealth: Payer: Self-pay | Admitting: Physician Assistant

## 2021-12-29 ENCOUNTER — Telehealth: Payer: Self-pay | Admitting: Gastroenterology

## 2021-12-29 DIAGNOSIS — D3A8 Other benign neuroendocrine tumors: Secondary | ICD-10-CM

## 2021-12-29 NOTE — Telephone Encounter (Signed)
Patient called requesting a referral to be sent to the [facility]. ?

## 2021-12-29 NOTE — Telephone Encounter (Signed)
The referral has been made and the pt aware that he will be getting a call from that office to make appt.  ?

## 2021-12-29 NOTE — Telephone Encounter (Signed)
Scheduled appt per 4/4 referral. Pt is aware of appt date and time. Pt is aware to arrive 15 mins prior to appt time and to bring and updated insurance card. Pt is aware of appt location.   ?

## 2021-12-29 NOTE — Telephone Encounter (Signed)
The pt had an EUS on 12/15.  He is asking for a referral to the [facility].  Should he be referred? ?

## 2021-12-31 ENCOUNTER — Encounter: Payer: Self-pay | Admitting: *Deleted

## 2021-12-31 MED ORDER — PEG 3350-KCL-NA BICARB-NACL 420 G PO SOLR: 4000.0000 mL | Freq: Once | ORAL | 0 refills | Status: AC

## 2021-12-31 NOTE — Addendum Note (Signed)
Addended by: Metro Kung on: 12/31/2021 10:27 AM ? ? Modules accepted: Orders ? ?

## 2021-12-31 NOTE — Progress Notes (Signed)
Spoke to pt.  Scheduled procedure for 01/25/2022 at 11:15, arrival 9:45 at [facility].  Reviewed prep instructions with pt by phone.  Pt informed that RX was sent to his pharmacy.  He is aware to pick up OTC required items as well.  Pt advised of how to adjust diabetes medications.  Pt aware that I am mailing out instructions.  Confirmed mailing address with pt. ?

## 2021-12-31 NOTE — Progress Notes (Signed)
? ? [place] ?Telephone:(336) [phone]   [phone] ? ?CONSULT NOTE ? ?REFERRING PHYSICIAN: Dr. Ardis Hughs ? ?REASON FOR CONSULTATION:  ?Neuroendocrine tumor ? ?HPI ?Jose Peters is a 61 y.o. male past medical history significant for borderline diabetes, hyperlipidemia, hypertension, and BPH is referred to the clinic for neuroendocrine tumor. ? ?Per chart review, it appears that the patient was seen at [facility] in October 2022 for the chief complaint of recurrent abdominal pain for 2 months without emesis, change or change in bowel habits, appetite change, or weight loss.  He underwent a CT scan of the abdomen pelvis on 07/16/2021 from [facility] which showed a partially calcified and half centimeter mass in the uncinate process of the pancreas measuring 1.1 cm.  The patient then underwent a EUS on 09/10/2021 under the care of Dr. Ardis Hughs which showed 1.2 cm round, hypoechoic mass in the pancreatic uncinate, perhaps wall of the duodenum.  ? ?He saw Dr. Zenia Resides on 11/03/21. Dr. Zenia Resides discussed that surgical resection would possibly require Whipple. Given the small size, the risk of metastasis is low and observation is safe. Dr. Zenia Resides does not think the benefits of resection at this size outweigh the risk and morbidity of a Whipple. He is not having any symptoms of NET. They are planning on repeating a CT with pancreas protocol IV contrast in 6 months from his EUS (June 2023), and she will see him back after that scan. If the tumor enlarges she noted that he will discuss resection.  Of note, the patient is going to undergo routine colonoscopy in [facility] on Jan 25, 2022. ? ?Overall, the patient is feeling well today.  He still continues to have intermittent right-sided abdominal discomfort that he characterizes as throbbing.  The pain comes and goes and is a 7 out of 10 at its worst.  He notes that the discomfort is worse with activity.  He denies any other associated/exacerbating symptoms  such as eating, palpation, etc.  He was given Robaxin previously which helped somewhat.  He also notes that ibuprofen helps somewhat.  He reports Tylenol does not help.  He reports the pain may resolve on its own before it reoccurs. ? ?He reports that he has been having frequent night sweats for "years".  Denies any fever, chills, or unexplained weight loss.  Denies any flushing, wheezing, or diarrhea.  Denies any unexplained weight loss, appetite change, nausea, vomiting, jaundice, or itching.  ? ?He denies any family history of malignancy.  The patient's mother has diabetes and hypertension.  The patient's father has diabetes.  The patient siblings are reportedly healthy. ? ?The patient works as a Conservator, museum/gallery at [employer].  He is married and accompanied by his wife today.  They have 2 sons.  Denies any drug, alcohol, or drug use. ? ? ?HPI ? ?Past Medical History:  ?Diagnosis Date  ? Diabetes mellitus   ? Hypertension   ? ? ?Past Surgical History:  ?Procedure Laterality Date  ? COLONOSCOPY  12/08/2011  ? Procedure: COLONOSCOPY;  Surgeon: Daneil Dolin, MD;  [facility];  Service: Endoscopy;  Laterality: N/A;  9:15 AM  ? ESOPHAGOGASTRODUODENOSCOPY (EGD) WITH PROPOFOL N/A 09/10/2021  ? Procedure: ESOPHAGOGASTRODUODENOSCOPY (EGD) WITH PROPOFOL;  Surgeon: Milus Banister, MD;  Location: [facility];  Service: Endoscopy;  Laterality: N/A;  ? EUS N/A 09/10/2021  ? Procedure: UPPER ENDOSCOPIC ULTRASOUND (EUS) RADIAL;  Surgeon: Milus Banister, MD;  Location: [facility];  Service: Endoscopy;  Laterality:  N/A;  ? FINE NEEDLE ASPIRATION N/A 09/10/2021  ? Procedure: FINE NEEDLE ASPIRATION (FNA) LINEAR;  Surgeon: Milus Banister, MD;  Location[facility];  Service: Endoscopy;  Laterality: N/A;  ? I & D EXTREMITY Right 01/08/2020  ? Procedure: IRRIGATION AND DEBRIDEMENT Right/Hand Thumb;  Surgeon: Roseanne Kaufman, MD;  Location: [place];  Service: Orthopedics;  Laterality: Right;  ? NEUROLYSIS OF MEDIAN  NERVE Right 01/08/2020  ? Procedure: Neuroplasty Repair of complex laceration;  Surgeon: Roseanne Kaufman, MD;  Location: [place];  Service: Orthopedics;  Laterality: Right;  ? PERCUTANEOUS PINNING Right 01/08/2020  ? Procedure: Open Repair Fracture Right Hand/Thumb;  Surgeon: Roseanne Kaufman, MD;  Location: [place];  Service: Orthopedics;  Laterality: Right;  ? ? ?No family history on file. ? ?Social History ?Social History  ? ?Tobacco Use  ? Smoking status: Never  ? Smokeless tobacco: Never  ?Substance Use Topics  ? Alcohol use: No  ? Drug use: No  ? ? ?No Known Allergies ? ?Current Outpatient Medications  ?Medication Sig Dispense Refill  ? amLODipine (NORVASC) 10 MG tablet Take 10 mg by mouth in the morning.    ? escitalopram (LEXAPRO) 20 MG tablet Take 20 mg by mouth in the morning.    ? JANUMET 50-1000 MG tablet Take 1 tablet by mouth 2 (two) times daily.    ? losartan (COZAAR) 100 MG tablet Take 100 mg by mouth every evening.    ? Multiple Vitamin (MULITIVITAMIN WITH MINERALS) TABS Take 1 tablet by mouth daily.    ? OZEMPIC, 1 MG/DOSE, 4 MG/3ML SOPN Inject 1 mg into the skin every Sunday.    ? simvastatin (ZOCOR) 40 MG tablet Take 40 mg by mouth in the morning.    ? tamsulosin (FLOMAX) 0.4 MG CAPS capsule Take 0.4 mg by mouth in the morning.    ? ?No current facility-administered medications for this visit.  ? ? ?REVIEW OF SYSTEMS:   ?Review of Systems  ?Constitutional: Negative for appetite change, chills, fatigue, fever and unexpected weight change.  ?HENT: Negative for mouth sores, nosebleeds, sore throat and trouble swallowing.   ?Eyes: Negative for eye problems and icterus.  ?Respiratory: Negative for cough, hemoptysis, shortness of breath and wheezing.   ?Cardiovascular: Negative for chest pain and leg swelling.  ?Gastrointestinal: Positive for intermittent right-sided abdominal pain.  Negative for abdominal pain, constipation, diarrhea, nausea and vomiting.  ?Genitourinary: Negative for bladder incontinence,  difficulty urinating, dysuria, frequency and hematuria.   ?Musculoskeletal: Negative for back pain, gait problem, neck pain and neck stiffness.  ?Skin: Negative for itching and rash.  ?Neurological: Negative for dizziness, extremity weakness, gait problem, headaches, light-headedness and seizures.  ?Hematological: Negative for adenopathy. Does not bruise/bleed easily.  ?Psychiatric/Behavioral: Negative for confusion, depression and sleep disturbance. The patient is not nervous/anxious.   ? ? ?PHYSICAL EXAMINATION:  ?Blood pressure (!) 155/84, pulse 87, temperature 97.8 ?F (36.6 ?C), temperature source Tympanic, weight 193 lb (87.5 kg), SpO2 100 %. ? ?ECOG PERFORMANCE STATUS: 1 ? ?Physical Exam  ?Constitutional: Oriented to person, place, and time and well-developed, well-nourished, and in no distress.  ?HENT:  ?Head: Normocephalic and atraumatic.  ?Mouth/Throat: Oropharynx is clear and moist. No oropharyngeal exudate.  ?Eyes: Conjunctivae are normal. Right eye exhibits no discharge. Left eye exhibits no discharge. No scleral icterus.  ?Neck: Normal range of motion. Neck supple.  ?Cardiovascular: Normal rate, regular rhythm, normal heart sounds and intact distal pulses.   ?Pulmonary/Chest: Effort normal and breath sounds normal. No respiratory distress. No  wheezes. No rales.  ?Abdominal: Soft. Bowel sounds are normal. Exhibits no distension and no mass. There is no tenderness.  ?Musculoskeletal: Normal range of motion. Exhibits no edema.  ?Lymphadenopathy:  ?  No cervical adenopathy.  ?Neurological: Alert and oriented to person, place, and time. Exhibits normal muscle tone. Gait normal. Coordination normal.  ?Skin: Skin is warm and dry. No rash noted. Not diaphoretic. No erythema. No pallor.  ?Psychiatric: Mood, memory and judgment normal.  ?Vitals reviewed. ? ?LABORATORY DATA: ?Lab Results  ?Component Value Date  ? WBC 7.0 01/08/2020  ? HGB 13.3 01/08/2020  ? HCT 39.9 01/08/2020  ? MCV 93.2 01/08/2020  ? PLT 164  01/08/2020  ? ? ?  Chemistry   ?   ?Component Value Date/Time  ? NA 136 01/08/2020 1729  ? K 4.0 01/08/2020 1729  ? CL 102 01/08/2020 1729  ? CO2 22 01/08/2020 1729  ? BUN 19 01/08/2020 1729  ? CREATININE

## 2022-01-01 ENCOUNTER — Encounter: Payer: Self-pay | Admitting: Emergency Medicine

## 2022-01-01 ENCOUNTER — Inpatient Hospital Stay: Payer: BC Managed Care – PPO | Attending: Physician Assistant | Admitting: Physician Assistant

## 2022-01-01 ENCOUNTER — Other Ambulatory Visit: Payer: Self-pay

## 2022-01-01 ENCOUNTER — Inpatient Hospital Stay: Payer: BC Managed Care – PPO

## 2022-01-01 ENCOUNTER — Telehealth: Payer: Self-pay | Admitting: *Deleted

## 2022-01-01 VITALS — BP 155/84 | HR 87 | Temp 97.8°F | Wt 193.0 lb

## 2022-01-01 DIAGNOSIS — D3A8 Other benign neuroendocrine tumors: Secondary | ICD-10-CM

## 2022-01-01 DIAGNOSIS — R1011 Right upper quadrant pain: Secondary | ICD-10-CM | POA: Diagnosis not present

## 2022-01-01 DIAGNOSIS — C7A8 Other malignant neuroendocrine tumors: Secondary | ICD-10-CM | POA: Diagnosis not present

## 2022-01-01 DIAGNOSIS — C7B8 Other secondary neuroendocrine tumors: Secondary | ICD-10-CM

## 2022-01-01 DIAGNOSIS — R109 Unspecified abdominal pain: Secondary | ICD-10-CM

## 2022-01-01 LAB — CBC WITH DIFFERENTIAL (CANCER CENTER ONLY)
Abs Immature Granulocytes: 0.02 10*3/uL (ref 0.00–0.07)
Basophils Absolute: 0 10*3/uL (ref 0.0–0.1)
Basophils Relative: 1 %
Eosinophils Absolute: 0.1 10*3/uL (ref 0.0–0.5)
Eosinophils Relative: 1 %
HCT: 38.5 % — ABNORMAL LOW (ref 39.0–52.0)
Hemoglobin: 13.6 g/dL (ref 13.0–17.0)
Immature Granulocytes: 0 %
Lymphocytes Relative: 33 %
Lymphs Abs: 2.1 10*3/uL (ref 0.7–4.0)
MCH: 31.8 pg (ref 26.0–34.0)
MCHC: 35.3 g/dL (ref 30.0–36.0)
MCV: 90 fL (ref 80.0–100.0)
Monocytes Absolute: 0.5 10*3/uL (ref 0.1–1.0)
Monocytes Relative: 8 %
Neutro Abs: 3.5 10*3/uL (ref 1.7–7.7)
Neutrophils Relative %: 57 %
Platelet Count: 185 10*3/uL (ref 150–400)
RBC: 4.28 MIL/uL (ref 4.22–5.81)
RDW: 12.9 % (ref 11.5–15.5)
WBC Count: 6.3 10*3/uL (ref 4.0–10.5)
nRBC: 0 % (ref 0.0–0.2)

## 2022-01-01 LAB — CMP (CANCER CENTER ONLY)
ALT: 40 U/L (ref 0–44)
AST: 25 U/L (ref 15–41)
Albumin: 4.6 g/dL (ref 3.5–5.0)
Alkaline Phosphatase: 42 U/L (ref 38–126)
Anion gap: 10 (ref 5–15)
BUN: 18 mg/dL (ref 8–23)
CO2: 26 mmol/L (ref 22–32)
Calcium: 10.5 mg/dL — ABNORMAL HIGH (ref 8.9–10.3)
Chloride: 104 mmol/L (ref 98–111)
Creatinine: 1.16 mg/dL (ref 0.61–1.24)
GFR, Estimated: >60 mL/min (ref >60)
Glucose, Bld: 252 mg/dL — ABNORMAL HIGH (ref 70–99)
Potassium: 4.3 mmol/L (ref 3.5–5.1)
Sodium: 140 mmol/L (ref 135–145)
Total Bilirubin: 0.4 mg/dL (ref 0.3–1.2)
Total Protein: 8.1 g/dL (ref 6.5–8.1)

## 2022-01-01 LAB — RESEARCH LABS

## 2022-01-01 MED ORDER — METHOCARBAMOL 500 MG PO TABS: 500.0000 mg | ORAL_TABLET | Freq: Three times a day (TID) | ORAL | 0 refills | Status: DC | PRN

## 2022-01-01 NOTE — Research (Signed)
MTG-015 - Tissue and Bodily Fluids: Translational Medicine: Discovery and Evaluation of Biomarkers/Pharmacogenomics for the Diagnosis and Personalized Management of Patients  ?  ?01/01/22 ?  ?Trial: [id number] ?  ?Informed Consent:  ?Patient Jose Peters was referred by [facility] PA as a potential candidate for the above listed study.  This Clinical Research Coordinator met with Jose Peters, MRN [id number], on 01/01/22 in a manner and location that ensures patient privacy to discuss participation in the above listed research study.  Patient is Accompanied by his wife [name] .  A copy of the informed consent document and separate HIPAA Authorization was provided to the patient.  Patient reads, speaks, and understands [place].   ?  ?Patient was provided with the business card of this Coordinator and encouraged to contact the research team with any questions.  Patient was provided the option of taking informed consent documents home to review and was encouraged to review at their convenience with their support network, including other care providers. Patient is comfortable with making a decision regarding study participation today. ?  ?As outlined in the informed consent form, this Coordinator and [employer] discussed the purpose of the research study, the investigational nature of the study, study procedures and requirements for study participation, potential risks and benefits of study participation, as well as alternatives to participation. This study is not blinded. The patient understands participation is voluntary and they may withdraw from study participation at any time.  This study does not involve randomization.  This study does not involve an investigational drug or device. This study does not involve a placebo. Patient understands enrollment is pending full eligibility review.  ?  ?Confidentiality and how the patient's information will be used as part of study participation were discussed.   Patient was informed there is reimbursement provided for their time and effort spent on trial participation.  The patient is encouraged to discuss research study participation with their insurance provider to determine what costs they may incur as part of study participation, including research related injury.   ?  ?All questions were answered to patient's satisfaction.  The informed consent and separate HIPAA Authorization was reviewed page by page.  The patient's mental and emotional status is appropriate to provide informed consent, and the patient verbalizes an understanding of study participation.  Patient has agreed to participate in the above listed research study and has voluntarily signed the informed consent version IRB approved 08/17/2021 and separate HIPAA Authorization, version 5 revised 09/12/2019  on 01/01/2022 at 10:45am.  The patient was provided with a copy of the signed informed consent form and separate HIPAA Authorization for their reference.  No study specific procedures were obtained prior to the signing of the informed consent document.  Approximately 10 minutes were spent with the patient reviewing the informed consent documents.  Patient was not requested to complete a Release of Information form. ?  ?Eligibility:  ?This Coordinator has reviewed this patient's inclusion and exclusion criteria and confirmed Jose Peters is eligible for study participation.  Patient will continue with enrollment. ?  ?Eligibility confirmed by treating investigator, who also agrees that patient should proceed with enrollment. ? ?Lab:  ?Blood specimen was collected today after consent was obtained. ? ?Gift Card:  ?The patient was provided a $50 visa gift card for participation in this study. ? ? ?Clabe Seal ?Clinical Research Coordinator I  ?01/01/22 11:43 AM ?

## 2022-01-01 NOTE — Research (Signed)
MTG-015 - Tissue and Bodily Fluids: Translational Medicine: Discovery and Evaluation of Biomarkers/Pharmacogenomics for the Diagnosis and Personalized Management of Patients  ? ?This Nurse has reviewed this patient's inclusion and exclusion criteria as a second review and confirms Jet E Casto is eligible for study participation in both MT 1410-A and MT 3505-A. Patient may continue with enrollment. ? ?Vickii Penna, RN, BSN, CPN ?Clinical Research Nurse I ?[phone] ? ?01/01/2022 10:55 AM ? ? ?

## 2022-01-01 NOTE — Telephone Encounter (Signed)
Contacted patient per provider request with message below: ?Contact patient to see if he is still taking medication for DM?  Please make sure he is monitoring his BS at home and his diet. If he is monitors at home and it is frequently high, he needs to see the PCP to adjust or restart his medications for DM.  ?Patient states he is on 2 meds for DM: JANUMET 50-1000 MG tablet twice a day and OZEMPIC, 1 MG/DOSE, 4 MG/3ML SOPN - inject 1 mg subcutaneous every Sunday. Patient stated he did not take these meds today before he came to cancer center for labs/appt. He thought that might be why his BS was so high. ? ?Patient verbalized understanding of provider concerns and states he will f/u with PCP. ? ?

## 2022-01-01 NOTE — Research (Signed)
MTG-015 - Tissue and Bodily Fluids: Translational Medicine: Discovery and Evaluation of Biomarkers/Pharmacogenomics for the Diagnosis and Personalized Management of Patients  ? ?01/01/22 ? ?Trial: [id number]  ? ?Patient Jose Peters was referred by [facility] PA as a potential candidate for the above listed study.  This Clinical Research Coordinator met with Jose Peters, MRN [id number], on 01/01/22 in a manner and location that ensures patient privacy to discuss participation in the above listed research study.  Patient is Accompanied by his wife [name] .  A copy of the informed consent document and separate HIPAA Authorization was provided to the patient.  Patient reads, speaks, and understands [place].   ? ?Patient was provided with the business card of this Coordinator and encouraged to contact the research team with any questions.  Patient was provided the option of taking informed consent documents home to review and was encouraged to review at their convenience with their support network, including other care providers. Patient is comfortable with making a decision regarding study participation today. ? ?As outlined in the informed consent form, this Coordinator and LEVANDER KATZENSTEIN discussed the purpose of the research study, the investigational nature of the study, study procedures and requirements for study participation, potential risks and benefits of study participation, as well as alternatives to participation. This study is not blinded. The patient understands participation is voluntary and they may withdraw from study participation at any time.  This study does not involve randomization.  This study does not involve an investigational drug or device. This study does not involve a placebo. Patient understands enrollment is pending full eligibility review.  ? ?Confidentiality and how the patient's information will be used as part of study participation were discussed.  Patient was informed there is  reimbursement provided for their time and effort spent on trial participation.  The patient is encouraged to discuss research study participation with their insurance provider to determine what costs they may incur as part of study participation, including research related injury.   ? ?All questions were answered to patient's satisfaction.  The informed consent and separate HIPAA Authorization was reviewed page by page.  The patient's mental and emotional status is appropriate to provide informed consent, and the patient verbalizes an understanding of study participation.  Patient has agreed to participate in the above listed research study and has voluntarily signed the informed consent version IRB approved 08/17/2021 and separate HIPAA Authorization, version 5 revised 09/12/2019  on 01/01/2022 at 10:45am.  The patient was provided with a copy of the signed informed consent form and separate HIPAA Authorization for their reference.  No study specific procedures were obtained prior to the signing of the informed consent document.  Approximately 10 minutes were spent with the patient reviewing the informed consent documents.  Patient was not requested to complete a Release of Information form. ? ?Eligibility:  ?This Coordinator has reviewed this patient's inclusion and exclusion criteria and confirmed Jose Peters is eligible for study participation.  Patient will continue with enrollment. ? ?Eligibility confirmed by treating investigator, who also agrees that patient should proceed with enrollment. ? ?Lab:  ?Blood specimen was collected today after consent was obtained. ? ?Gift Card:  ?The patient was provided a $50 visa gift card for participation in this study. ? ?Clabe Seal ?Clinical Research Coordinator I  ?01/01/22 11:43 AM ?

## 2022-01-01 NOTE — Patient Instructions (Addendum)
It was nice meeting you today. ?-Dr. Ardis Hughs did the procedure where he took a sample (biopsy) from the pancreas.  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  In general, these are slow-growing tumors.  Most of the time patients will get surgery if it is surgically resectable or just monitor closely.  Dr. Zenia Resides is planning on doing a repeat CT scan in June 2023 and to follow-up with you a few days later.  If it enlarges, they may discuss surgery with you. The surgery is called a Whipple procedure.  If it stays the same, she may opt to monitor you closely.  If she does surgery, generally she will continue to monitor you and we do not need to see you here.  However if she would like to push back your follow-up appointment since we are doing the PET scan or if she would like [place] to follow you, we are happy to do so. ?-In the meantime, we are going to run some test on you.  We are going to repeat a blood count today, check your kidney, liver, and electrolyte function, get some thing we call a tumor marker to get a baseline, and do a 24-hour urine test (see below) ?-Me or Dr. Burr Medico will call you back to review the results of your PET scan.  He should call you from the imaging department to get this scheduled.  I expect this to be in the next 10 to 14 days or so.  In case you need the number to radiology, their number is [phone]. ? ? ?Thank you for choosing [place] to provide your care.   ?Should you have questions after your visit to the [facility]), please contact this office at [phone] between 8:30 AM and 4:30 PM.  ?Voice mails left after 4:00 PM may not be returned until the following business day.  ?Calls received after 4:30 PM will be answered by an off-site Nurse Triage Line. ?   ?Prescription Refills:  Please have your pharmacy contact us directly for most prescription requests.  Contact the office directly for  refills of narcotics (pain medications). Allow 48-72 hours for refills. ? ?Appointments: Please contact the [facility] scheduling department [phone] for questions regarding [facility] appointment scheduling.  Contact the schedulers with any scheduling changes so that your appointment can be rescheduled in a timely manner.  ? ?Central Scheduling for [facility] [phone] - Call to schedule procedures such as PET scans, CT scans, MRI, Ultrasound, etc. ? ?To afford each patient quality time with our providers, please arrive 30 minutes before your scheduled appointment time.  If you arrive late for your appointment, you may be asked to reschedule.  We strive to give you quality time with our providers, and arriving late affects you and other patients whose appointments are after yours. If you are a no show for multiple scheduled visits, you may be dismissed from the clinic at the providers discretion.   ?  ?Resources: ?[place] [employer] [phone] for additional information on assistance programs or assistance connecting with community support programs   ?[place]  [phone]: Information regarding food stamps, Medicaid, and utility assistance ?[place] [employer] [place] [employer]'s shared-ride transportation service for eligible riders who have a disability that prevents them from riding the fixed route bus.   ?Medicare [place] 863-288-7680 Helps people with Medicare understand their rights and benefits, navigate the Medicare system, and  secure the quality healthcare they deserve ?[place] 936-383-2257 Assists patients locate various types of support and financial assistance ?Cancer Care: 1-800-813-HOPE 515-189-6485) Provides financial assistance, online support groups, medication/co-pay assistance.   ?Engineer, water for appointments at [facility]: [facility] [phone] ? ?Again, thank you for choosing [facility] for your  care.    ? ? ? ?Liver small calcified granulomas.  Borderline enlarged.  Focal fatty infiltration about the falciform ligament. ?Gallbladder distended gallbladder without inflammation.  No biliary dilation. ?Spleen normal. ?Pancreas partially calcified enhancing mass about the uncinate process of the pancreas measuring 1.1 cm. ?Adrenal glands normal. ?Kidneys no perinephric stranding or hydronephrosis.  Right renal cyst and too small to characterize bilateral hypodensities.  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 ?Lymphadenopathy none ?Fluid no free fluid or fluid collection. ?Other fat-containing umbilical hernia. ?Small bowel grossly unremarkable. ?: Large amount retained stool.  No active inflammation.  Appendix appears unremarkable. ?Fluid no free fluid or fluid collection. ?Lymphadenopathy none. ?Vascular no significant abnormality. ?Other decompressed urinary bladder is unremarkable.  Prostamegaly with indention of the base of the bladder. ?Degenerative changes and scoliosis.  No fracture or suspicious lesion.  Minimal retrolithiasis of L4 on L5. ?Impression ?No acute process. ?Partially calcified enhancing 1.1 cm mass involving the uncinate process of the pancreas.  Though nonspecific, this is suspicious for neuroendocrine tumor. ?Large amount retained colonic stool. ?Additional findings as detailed above.  ?  ? ?

## 2022-01-03 DIAGNOSIS — C7A8 Other malignant neuroendocrine tumors: Secondary | ICD-10-CM | POA: Diagnosis not present

## 2022-01-03 DIAGNOSIS — R1011 Right upper quadrant pain: Secondary | ICD-10-CM | POA: Diagnosis not present

## 2022-01-04 ENCOUNTER — Other Ambulatory Visit: Payer: Self-pay

## 2022-01-04 DIAGNOSIS — D3A8 Other benign neuroendocrine tumors: Secondary | ICD-10-CM

## 2022-01-06 ENCOUNTER — Telehealth: Payer: Self-pay | Admitting: Emergency Medicine

## 2022-01-06 LAB — CHROMOGRANIN A: Chromogranin A (ng/mL): 72.5 ng/mL (ref 0.0–101.8)

## 2022-01-06 NOTE — Telephone Encounter (Signed)
MTG-015 - Tissue and Bodily Fluids: Translational Medicine: Discovery and Evaluation of Biomarkers/Pharmacogenomics for the Diagnosis and Personalized Management of Patients  ? ?01/06/22 ? ?Called to collect data for this study.  Patient did not answer, left voicemail requesting return call. ? ?Clabe Seal ?Clinical Research Coordinator I  ?01/06/22  3:07 PM ? ?

## 2022-01-07 ENCOUNTER — Telehealth: Payer: Self-pay | Admitting: Emergency Medicine

## 2022-01-07 NOTE — Telephone Encounter (Signed)
MTG-015 - Tissue and Bodily Fluids: Translational Medicine: Discovery and Evaluation of Biomarkers/Pharmacogenomics for the Diagnosis and Personalized Management of Patients  ? ?01/07/22 ? ?Called to collect data for this study.  Called home phone and patient's wife answered.  She advised me to call patient's mobile number.   ? ?Called mobile number, no answer, left voicemail requesting return call. ? ?Clabe Seal ?Clinical Research Coordinator I  ?01/07/22  9:41 AM ?

## 2022-01-10 LAB — 5 HIAA, QUANTITATIVE, URINE, 24 HOUR
5-HIAA, Ur: 3.3 mg/L
5-HIAA,Quant.,24 Hr Urine: 5.6 mg/24 hr (ref 0.0–14.9)
Total Volume: 1700

## 2022-01-11 ENCOUNTER — Telehealth: Payer: Self-pay

## 2022-01-11 NOTE — Telephone Encounter (Signed)
I left a message for the pt requesting a return call. ?

## 2022-01-11 NOTE — Telephone Encounter (Signed)
-----   Message from Gracelyn Nurse, PA-C sent at 01/11/2022  1:26 PM EDT ----- ?Can you call him and let him know that the urine did not show any elevation in the marker we were looking for? Either way, the plan is the same. This was just for baseline testing.  ?----- Message ----- ?From: Interface, Lab In [place] ?Sent: 01/10/2022   7:35 PM EDT ?To: Cassandra L Heilingoetter, PA-C ? ? ?

## 2022-01-11 NOTE — Progress Notes (Signed)
I spoke with Mrs Jose Peters and relayed the date, time, location, and instructions for Jose Peters's [facility] Detect[employer] PET scan.   I told her we will call with the results.  All questions were answered.  She verbalized understanding. ? ?

## 2022-01-12 ENCOUNTER — Telehealth: Payer: Self-pay | Admitting: Emergency Medicine

## 2022-01-12 NOTE — Telephone Encounter (Signed)
MTG-015 - Tissue and Bodily Fluids: Translational Medicine: Discovery and Evaluation of Biomarkers/Pharmacogenomics for the Diagnosis and Personalized Management of Patients  ? ?01/12/22 ? ?Called to medical and family history data for this study.  Patient did not answer, left voicemail requesting return call. ? ?Clabe Seal ?Clinical Research Coordinator I  ?01/12/22  9:47 AM ? ?

## 2022-01-13 NOTE — Telephone Encounter (Signed)
I've left another message for the pt requesting a return call. I have also now called pts wife and advised of the results. She expressed understanding of the information and advised she will pass the information along to the pt. ?

## 2022-01-14 ENCOUNTER — Telehealth: Payer: Self-pay | Admitting: Emergency Medicine

## 2022-01-14 NOTE — Telephone Encounter (Signed)
MTG-015 - Tissue and Bodily Fluids: Translational Medicine: Discovery and Evaluation of Biomarkers/Pharmacogenomics for the Diagnosis and Personalized Management of Patients  ? ?01/14/22 ? ?Called to medical and family history data for this study.  Patient did not answer, left voicemail requesting return call. ? ?Jose Peters ?Clinical Research Coordinator I  ?01/14/22  2:04 PM ? ?

## 2022-01-19 ENCOUNTER — Telehealth (INDEPENDENT_AMBULATORY_CARE_PROVIDER_SITE_OTHER): Payer: Self-pay | Admitting: *Deleted

## 2022-01-19 NOTE — Telephone Encounter (Signed)
Patient called and ask for you and said he was little confused. ? ?He has been triaged already and scheduled for his procedure 5/1. ? ?Can you check and see why you may have called him and let him know please. ?

## 2022-01-21 ENCOUNTER — Ambulatory Visit (HOSPITAL_COMMUNITY)
Admission: RE | Admit: 2022-01-21 | Discharge: 2022-01-21 | Disposition: A | Discharge status: Home / Self Care | Payer: BC Managed Care – PPO | Source: Ambulatory Visit | Attending: Physician Assistant | Admitting: Physician Assistant

## 2022-01-21 ENCOUNTER — Telehealth: Payer: Self-pay | Admitting: Emergency Medicine

## 2022-01-21 ENCOUNTER — Telehealth: Payer: Self-pay | Admitting: Physician Assistant

## 2022-01-21 DIAGNOSIS — D3A8 Other benign neuroendocrine tumors: Secondary | ICD-10-CM | POA: Diagnosis not present

## 2022-01-21 DIAGNOSIS — C254 Malignant neoplasm of endocrine pancreas: Secondary | ICD-10-CM | POA: Diagnosis not present

## 2022-01-21 MED ORDER — COPPER CU 64 DOTATATE 1 MCI/ML IV SOLN
4.0000 | Freq: Once | INTRAVENOUS | Status: AC
  Administered 2022-01-21: 4 via INTRAVENOUS

## 2022-01-21 NOTE — Telephone Encounter (Signed)
I attempted to call the patient on his mobile phone and his home number to review his PET scan results.  I was unable to reach him on either number.  I left our callback number and also mention that I will try and call him back tomorrow around 9 AM as well. ?

## 2022-01-21 NOTE — Telephone Encounter (Signed)
MTG-015 - Tissue and Bodily Fluids: Translational Medicine: Discovery and Evaluation of Biomarkers/Pharmacogenomics for the Diagnosis and Personalized Management of Patients  ? ?01/21/22 ? ?Called to medical and family history data for this study.  Patient did not answer, left voicemail requesting return call. ? ?Clabe Seal ?Clinical Research Coordinator I  ?01/21/22  4:16 PM ? ?

## 2022-01-22 ENCOUNTER — Telehealth: Payer: Self-pay | Admitting: Physician Assistant

## 2022-01-22 ENCOUNTER — Ambulatory Visit (HOSPITAL_COMMUNITY): Payer: BC Managed Care – PPO

## 2022-01-22 NOTE — Telephone Encounter (Signed)
I called the patient to review his PET scan results. He was appreciative of the call.  ?

## 2022-01-25 ENCOUNTER — Encounter (HOSPITAL_COMMUNITY)
Admission: RE | Disposition: A | Discharge status: Home / Self Care | Payer: Self-pay | Source: Home / Self Care | Attending: Internal Medicine

## 2022-01-25 ENCOUNTER — Encounter (HOSPITAL_COMMUNITY): Payer: Self-pay | Admitting: Internal Medicine

## 2022-01-25 ENCOUNTER — Other Ambulatory Visit: Payer: Self-pay

## 2022-01-25 ENCOUNTER — Ambulatory Visit (HOSPITAL_COMMUNITY)
Admission: RE | Admit: 2022-01-25 | Discharge: 2022-01-25 | Disposition: A | Discharge status: Home / Self Care | Payer: BC Managed Care – PPO | Attending: Internal Medicine | Admitting: Internal Medicine

## 2022-01-25 ENCOUNTER — Ambulatory Visit (HOSPITAL_COMMUNITY): Payer: BC Managed Care – PPO | Admitting: Certified Registered"

## 2022-01-25 DIAGNOSIS — K635 Polyp of colon: Secondary | ICD-10-CM

## 2022-01-25 DIAGNOSIS — D122 Benign neoplasm of ascending colon: Secondary | ICD-10-CM | POA: Insufficient documentation

## 2022-01-25 DIAGNOSIS — I10 Essential (primary) hypertension: Secondary | ICD-10-CM | POA: Insufficient documentation

## 2022-01-25 DIAGNOSIS — Z7984 Long term (current) use of oral hypoglycemic drugs: Secondary | ICD-10-CM | POA: Diagnosis not present

## 2022-01-25 DIAGNOSIS — Z139 Encounter for screening, unspecified: Secondary | ICD-10-CM | POA: Diagnosis not present

## 2022-01-25 DIAGNOSIS — E119 Type 2 diabetes mellitus without complications: Secondary | ICD-10-CM | POA: Insufficient documentation

## 2022-01-25 DIAGNOSIS — Z1211 Encounter for screening for malignant neoplasm of colon: Secondary | ICD-10-CM | POA: Diagnosis not present

## 2022-01-25 HISTORY — PX: COLONOSCOPY WITH PROPOFOL: SHX5780

## 2022-01-25 HISTORY — PX: POLYPECTOMY: SHX5525

## 2022-01-25 LAB — GLUCOSE, CAPILLARY: Glucose-Capillary: 213 mg/dL — ABNORMAL HIGH (ref 70–99)

## 2022-01-25 SURGERY — COLONOSCOPY WITH PROPOFOL
Anesthesia: General

## 2022-01-25 MED ORDER — STERILE WATER FOR IRRIGATION IR SOLN
Status: DC | PRN
  Administered 2022-01-25: 180 mL

## 2022-01-25 MED ORDER — PROPOFOL 10 MG/ML IV BOLUS
INTRAVENOUS | Status: DC | PRN
  Administered 2022-01-25: 50 mg via INTRAVENOUS
  Administered 2022-01-25: 100 mg via INTRAVENOUS

## 2022-01-25 MED ORDER — LIDOCAINE HCL (CARDIAC) PF 100 MG/5ML IV SOSY
PREFILLED_SYRINGE | INTRAVENOUS | Status: DC | PRN
  Administered 2022-01-25: 50 mg via INTRAVENOUS

## 2022-01-25 MED ORDER — LACTATED RINGERS IV SOLN: INTRAVENOUS | Status: DC

## 2022-01-25 MED ORDER — PROPOFOL 500 MG/50ML IV EMUL
INTRAVENOUS | Status: DC | PRN
  Administered 2022-01-25: 150 ug/kg/min via INTRAVENOUS

## 2022-01-25 NOTE — Discharge Instructions (Signed)
?  Colonoscopy ?Discharge Instructions ? ?Read the instructions outlined below and refer to this sheet in the next few weeks. These discharge instructions provide you with general information on caring for yourself after you leave the hospital. Your doctor may also give you specific instructions. While your treatment has been planned according to the most current medical practices available, unavoidable complications occasionally occur. If you have any problems or questions after discharge, call Dr. Gala Romney at [phone]. ?ACTIVITY ?You may resume your regular activity, but move at a slower pace for the next 24 hours.  ?Take frequent rest periods for the next 24 hours.  ?Walking will help get rid of the air and reduce the bloated feeling in your belly (abdomen).  ?No driving for 24 hours (because of the medicine (anesthesia) used during the test).   ?Do not sign any important legal documents or operate any machinery for 24 hours (because of the anesthesia used during the test).  ?NUTRITION ?Drink plenty of fluids.  ?You may resume your normal diet as instructed by your doctor.  ?Begin with a light meal and progress to your normal diet. Heavy or fried foods are harder to digest and may make you feel sick to your stomach (nauseated).  ?Avoid alcoholic beverages for 24 hours or as instructed.  ?MEDICATIONS ?You may resume your normal medications unless your doctor tells you otherwise.  ?WHAT YOU CAN EXPECT TODAY ?Some feelings of bloating in the abdomen.  ?Passage of more gas than usual.  ?Spotting of blood in your stool or on the toilet paper.  ?IF YOU HAD POLYPS REMOVED DURING THE COLONOSCOPY: ?No aspirin products for 7 days or as instructed.  ?No alcohol for 7 days or as instructed.  ?Eat a soft diet for the next 24 hours.  ?FINDING OUT THE RESULTS OF YOUR TEST ?Not all test results are available during your visit. If your test results are not back during the visit, make an appointment with your caregiver to find out the  results. Do not assume everything is normal if you have not heard from your caregiver or the medical facility. It is important for you to follow up on all of your test results.  ?SEEK IMMEDIATE MEDICAL ATTENTION IF: ?You have more than a spotting of blood in your stool.  ?Your belly is swollen (abdominal distention).  ?You are nauseated or vomiting.  ?You have a temperature over 101.  ?You have abdominal pain or discomfort that is severe or gets worse throughout the day.   ? ?1 small polyp found and removed ? ?Further recommendations to follow pending review of pathology report ? ?At patient request, I called Cinque Begley at [phone] findings and recommendations ? ?

## 2022-01-25 NOTE — Anesthesia Postprocedure Evaluation (Signed)
Anesthesia Post Note ? ?Patient: Jose Peters ? ?Procedure(s) Performed: COLONOSCOPY WITH PROPOFOL ?POLYPECTOMY ? ?Patient location during evaluation: Phase II ?Anesthesia Type: General ?Level of consciousness: awake ?Pain management: pain level controlled ?Vital Signs Assessment: post-procedure vital signs reviewed and stable ?Respiratory status: spontaneous breathing and respiratory function stable ?Cardiovascular status: blood pressure returned to baseline and stable ?Postop Assessment: no headache and no apparent nausea or vomiting ?Anesthetic complications: no ?Comments: Late entry ? ? ?No notable events documented. ? ? ?Last Vitals:  ?Vitals:  ? 01/25/22 0951 01/25/22 1037  ?BP: (!) 167/87 121/60  ?Pulse: 81 66  ?Resp: 14 16  ?Temp: 36.4 ?C   ?SpO2: 98% 100%  ?  ?Last Pain:  ?Vitals:  ? 01/25/22 1037  ?TempSrc: Oral  ?PainSc: 0-No pain  ? ? ?  ?  ?  ?  ?  ?  ? ?[name] ? ? ? ? ?

## 2022-01-25 NOTE — Anesthesia Preprocedure Evaluation (Signed)
Anesthesia Evaluation  Patient identified by MRN, date of birth, ID band Patient awake    Reviewed: Allergy & Precautions, H&P , NPO status , Patient's Chart, lab work & pertinent test results, reviewed documented beta blocker date and time   Airway Mallampati: II  TM Distance: >3 FB Neck ROM: full    Dental no notable dental hx.    Pulmonary neg pulmonary ROS,    Pulmonary exam normal breath sounds clear to auscultation       Cardiovascular Exercise Tolerance: Good hypertension, negative cardio ROS   Rhythm:regular Rate:Normal     Neuro/Psych negative neurological ROS  negative psych ROS   GI/Hepatic negative GI ROS, Neg liver ROS,   Endo/Other  negative endocrine ROSdiabetes, Type 2  Renal/GU negative Renal ROS  negative genitourinary   Musculoskeletal   Abdominal   Peds  Hematology negative hematology ROS (+)   Anesthesia Other Findings   Reproductive/Obstetrics negative OB ROS                             Anesthesia Physical Anesthesia Plan  ASA: 2  Anesthesia Plan: General   Post-op Pain Management:    Induction:   PONV Risk Score and Plan: Propofol infusion  Airway Management Planned:   Additional Equipment:   Intra-op Plan:   Post-operative Plan:   Informed Consent: I have reviewed the patients History and Physical, chart, labs and discussed the procedure including the risks, benefits and alternatives for the proposed anesthesia with the patient or authorized representative who has indicated his/her understanding and acceptance.     Dental Advisory Given  Plan Discussed with: CRNA  Anesthesia Plan Comments:         Anesthesia Quick Evaluation  

## 2022-01-25 NOTE — Op Note (Signed)
[facility] ?Patient Name: Jose Peters ?Procedure Date: 01/25/2022 9:56 AM ?MRN: [id number] ?Date of Birth: Sep 29, 1960 ?Attending MD: Norvel Richards , MD ?CSN: [id number] ?Age: 61 ?Admit Type: Outpatient ?Procedure:                Colonoscopy ?Indications:              Screening for colorectal malignant neoplasm ?Providers:                Norvel Richards, MD, Caprice Kluver, Suzan Garibaldi.  ?                          Risa Grill, Technician ?Referring MD:              ?Medicines:                Propofol per Anesthesia ?Complications:            No immediate complications. ?Estimated Blood Loss:     Estimated blood loss was minimal. ?Procedure:                Pre-Anesthesia Assessment: ?                          - Prior to the procedure, a History and Physical  ?                          was performed, and patient medications and  ?                          allergies were reviewed. The patient's tolerance of  ?                          previous anesthesia was also reviewed. The risks  ?                          and benefits of the procedure and the sedation  ?                          options and risks were discussed with the patient.  ?                          All questions were answered, and informed consent  ?                          was obtained. Prior Anticoagulants: The patient has  ?                          taken no previous anticoagulant or antiplatelet  ?                          agents. ASA Grade Assessment: II - A patient with  ?                          mild systemic disease. After reviewing the risks  ?  and benefits, the patient was deemed in  ?                          satisfactory condition to undergo the procedure. ?                          After obtaining informed consent, the colonoscope  ?                          was passed under direct vision. Throughout the  ?                          procedure, the patient's blood pressure, pulse, and  ?                           oxygen saturations were monitored continuously. The  ?                          [id number]) scope was introduced through the  ?                          anus and advanced to the the cecum, identified by  ?                          appendiceal orifice and ileocecal valve. The  ?                          colonoscopy was performed without difficulty. The  ?                          patient tolerated the procedure well. The quality  ?                          of the bowel preparation was adequate. ?Scope In: 10:18:21 AM ?Scope Out: 10:33:54 AM ?Scope Withdrawal Time: 0 hours 10 minutes 37 seconds  ?Total Procedure Duration: 0 hours 15 minutes 33 seconds  ?Findings: ?     The perianal and digital rectal examinations were normal. ?     A 7 mm polyp was found in the ascending colon. The polyp was sessile.  ?     The polyp was removed with a cold snare. Resection and retrieval were  ?     complete. Estimated blood loss was minimal. Estimated blood loss was  ?     minimal. ?     The exam was otherwise without abnormality on direct and retroflexion  ?     views. ?Impression:               - One 7 mm polyp in the ascending colon, removed  ?                          with a cold snare. Resected and retrieved. ?                          - The examination was otherwise normal on direct  ?  and retroflexion views. ?Moderate Sedation: ?     Moderate (conscious) sedation was personally administered by an  ?     anesthesia professional. The following parameters were monitored: oxygen  ?     saturation, heart rate, blood pressure, respiratory rate, EKG, adequacy  ?     of pulmonary ventilation, and response to care. ?Recommendation:           - Patient has a contact number available for  ?                          emergencies. The signs and symptoms of potential  ?                          delayed complications were discussed with the  ?                          patient. Return to normal activities  tomorrow.  ?                          Written discharge instructions were provided to the  ?                          patient. ?                          - Resume previous diet. ?                          - Continue present medications. ?                          - Repeat colonoscopy date to be determined after  ?                          pending pathology results are reviewed for  ?                          surveillance. ?                          - Return to GI office (date not yet determined). ?Procedure Code(s):        --- Professional --- ?                          [id number], Colonoscopy, flexible; with removal of  ?                          tumor(s), polyp(s), or other lesion(s) by snare  ?                          technique ?Diagnosis Code(s):        --- Professional --- ?                          Z12.11, Encounter for screening for malignant  ?  neoplasm of colon ?                          K63.5, Polyp of colon ?CPT copyright 2019 [employer] Medical [employer]. All rights reserved. ?The codes documented in this report are preliminary and upon coder review may  ?be revised to meet current compliance requirements. ?Cristopher Estimable. Tieshia Rettinger, MD ?Norvel Richards, MD ?01/25/2022 12:00:58 PM ?This report has been signed electronically. ?Number of Addenda: 0 ?

## 2022-01-25 NOTE — H&P (Signed)
done

## 2022-01-25 NOTE — Transfer of Care (Signed)
Immediate Anesthesia Transfer of Care Note ? ?Patient: Jose Peters ? ?Procedure(s) Performed: COLONOSCOPY WITH PROPOFOL ?POLYPECTOMY ? ?Patient Location: Endoscopy Unit ? ?Anesthesia Type:General ? ?Level of Consciousness: drowsy ? ?Airway & Oxygen Therapy: Patient Spontanous Breathing ? ?Post-op Assessment: Report given to RN and Post -op Vital signs reviewed and stable ? ?Post vital signs: Reviewed and stable ? ?Last Vitals:  ?Vitals Value Taken Time  ?BP    ?Temp    ?Pulse    ?Resp    ?SpO2    ? ? ?Last Pain:  ?Vitals:  ? 01/25/22 1015  ?TempSrc:   ?PainSc: 0-No pain  ?   ? ?Patients Stated Pain Goal: 7 (01/25/22 0951) ? ?Complications: No notable events documented. ?

## 2022-01-25 NOTE — Anesthesia Procedure Notes (Signed)
Date/Time: 01/25/2022 10:15 AM ?Performed by: Orlie Dakin, CRNA ?Pre-anesthesia Checklist: Patient identified, Emergency Drugs available, Suction available and Patient being monitored ?Patient Re-evaluated:Patient Re-evaluated prior to induction ?Oxygen Delivery Method: Nasal cannula ?Induction Type: IV induction ?Placement Confirmation: positive ETCO2 ? ? ? ? ?

## 2022-01-25 NOTE — H&P (Signed)
?$'@LOGO'H$ @ ? ? ?Primary Care Physician:  Redmond School, MD ?Primary Gastroenterologist:  Dr. Gala Romney ? ?Pre-Procedure History & Physical: ?HPI:  Jose Peters is a 61 y.o. male is here for a screening colonoscopy.  No bowel symptoms.  Negative colonoscopy 10 years ago.  No family history of colon cancer. ? ? ? ?Past Medical History:  ?Diagnosis Date  ? Diabetes mellitus   ? Hypertension   ? ? ?Past Surgical History:  ?Procedure Laterality Date  ? COLONOSCOPY  12/08/2011  ? Procedure: COLONOSCOPY;  Surgeon: Daneil Dolin, MD;  [facility]: [facility];  Service: Endoscopy;  Laterality: N/A;  9:15 AM  ? ESOPHAGOGASTRODUODENOSCOPY (EGD) WITH PROPOFOL N/A 09/10/2021  ? Procedure: ESOPHAGOGASTRODUODENOSCOPY (EGD) WITH PROPOFOL;  Surgeon: Milus Banister, MD;  Location: [facility];  Service: Endoscopy;  Laterality: N/A;  ? EUS N/A 09/10/2021  ? Procedure: UPPER ENDOSCOPIC ULTRASOUND (EUS) RADIAL;  Surgeon: Milus Banister, MD;  Location: [facility];  Service: Endoscopy;  Laterality: N/A;  ? FINE NEEDLE ASPIRATION N/A 09/10/2021  ? Procedure: FINE NEEDLE ASPIRATION (FNA) LINEAR;  Surgeon: Milus Banister, MD;  Location: [facility];  Service: Endoscopy;  Laterality: N/A;  ? I & D EXTREMITY Right 01/08/2020  ? Procedure: IRRIGATION AND DEBRIDEMENT Right/Hand Thumb;  Surgeon: Roseanne Kaufman, MD;  Location: [place];  Service: Orthopedics;  Laterality: Right;  ? NEUROLYSIS OF MEDIAN NERVE Right 01/08/2020  ? Procedure: Neuroplasty Repair of complex laceration;  Surgeon: Roseanne Kaufman, MD;  Location: [place];  Service: Orthopedics;  Laterality: Right;  ? PERCUTANEOUS PINNING Right 01/08/2020  ? Procedure: Open Repair Fracture Right Hand/Thumb;  Surgeon: Roseanne Kaufman, MD;  Location: [place];  Service: Orthopedics;  Laterality: Right;  ? ? ?Prior to Admission medications   ?Medication Sig Start Date End Date Taking? Authorizing Provider  ?amLODipine (NORVASC) 10 MG tablet Take 10 mg by mouth in the morning. 12/18/19  Yes  [provider]  ?escitalopram (LEXAPRO) 20 MG tablet Take 20 mg by mouth in the morning. 11/04/19  Yes [provider]  ?JANUMET 50-1000 MG tablet Take 1 tablet by mouth 2 (two) times daily. 01/08/20  Yes [provider]  ?losartan (COZAAR) 100 MG tablet Take 100 mg by mouth every evening. 11/04/19  Yes [provider]  ?methocarbamol (ROBAXIN) 500 MG tablet Take 1 tablet (500 mg total) by mouth every 8 (eight) hours as needed for muscle spasms. 01/01/22  Yes Heilingoetter, Cassandra L, PA-C  ?Multiple Vitamin (MULITIVITAMIN WITH MINERALS) TABS Take 1 tablet by mouth daily. men   Yes [provider]  ?OZEMPIC, 1 MG/DOSE, 4 MG/3ML SOPN Inject 1 mg into the skin every Sunday. 07/29/21  Yes [provider]  ?simvastatin (ZOCOR) 40 MG tablet Take 40 mg by mouth in the morning.   Yes [provider]  ?tamsulosin (FLOMAX) 0.4 MG CAPS capsule Take 0.4 mg by mouth in the morning. 07/07/21  Yes [provider]  ? ? ?Allergies as of 12/31/2021  ? (No Known Allergies)  ? ? ?History reviewed. No pertinent family history. ? ?Social History  ? ?Socioeconomic History  ? Marital status: Married  ?  Spouse name: Not on file  ? Number of children: Not on file  ? Years of education: Not on file  ? Highest education level: Not on file  ?Occupational History  ? Not on file  ?Tobacco Use  ? Smoking status: Never  ? Smokeless tobacco: Never  ?Substance and Sexual Activity  ? Alcohol use: No  ?  Drug use: No  ? Sexual activity: Not on file  ?Other Topics Concern  ? Not on file  ?Social History Narrative  ? Not on file  ? ?Social Determinants of Health  ? ?Financial Resource Strain: Not on file  ?Food Insecurity: Not on file  ?Transportation Needs: Not on file  ?Physical Activity: Not on file  ?Stress: Not on file  ?Social Connections: Not on file  ?Intimate Partner Violence: Not on file  ? ? ?Review of Systems: ?See HPI, otherwise negative ROS ? ?Physical Exam: ?BP (!) 167/87    Pulse 81   Temp 97.6 ?F (36.4 ?C) (Oral)   Resp 14   Ht 6' (1.829 m)   Wt 87.5 kg   SpO2 98%   BMI 26.18 kg/m?  ?General:   Alert,  Well-developed, well-nourished, pleasant and cooperative in NAD ?Neck:  Supple; no masses or thyromegaly. ?Lungs:  Clear throughout to auscultation.   No wheezes, crackles, or rhonchi. No acute distress. ?Heart:  Regular rate and rhythm; no murmurs, clicks, rubs,  or gallops. ?Abdomen:  Soft, nontender and nondistended. No masses, hepatosplenomegaly or hernias noted. Normal bowel sounds, without guarding, and without rebound.   ?Impression/Plan: ?Jose Peters is now here to undergo a screening colonoscopy.  Average risk screening examination. ? ?Risks, benefits, limitations, imponderables and alternatives regarding colonoscopy have been reviewed with the patient. Questions have been answered. All parties agreeable.  ? ? ? ?Notice:  This dictation was prepared with Dragon dictation along with smaller phrase technology. Any transcriptional errors that result from this process are unintentional and may not be corrected upon review.  ?

## 2022-01-26 ENCOUNTER — Encounter: Payer: Self-pay | Admitting: Internal Medicine

## 2022-01-26 LAB — SURGICAL PATHOLOGY

## 2022-02-01 ENCOUNTER — Encounter (HOSPITAL_COMMUNITY): Payer: Self-pay | Admitting: Internal Medicine

## 2022-02-01 ENCOUNTER — Other Ambulatory Visit: Payer: Self-pay | Admitting: Surgery

## 2022-02-01 DIAGNOSIS — D3A8 Other benign neuroendocrine tumors: Secondary | ICD-10-CM

## 2022-02-16 DIAGNOSIS — C259 Malignant neoplasm of pancreas, unspecified: Secondary | ICD-10-CM | POA: Diagnosis not present

## 2022-02-16 DIAGNOSIS — E7849 Other hyperlipidemia: Secondary | ICD-10-CM | POA: Diagnosis not present

## 2022-02-16 DIAGNOSIS — R350 Frequency of micturition: Secondary | ICD-10-CM | POA: Diagnosis not present

## 2022-02-16 DIAGNOSIS — E1165 Type 2 diabetes mellitus with hyperglycemia: Secondary | ICD-10-CM | POA: Diagnosis not present

## 2022-02-16 DIAGNOSIS — Z6825 Body mass index (BMI) 25.0-25.9, adult: Secondary | ICD-10-CM | POA: Diagnosis not present

## 2022-02-16 DIAGNOSIS — E782 Mixed hyperlipidemia: Secondary | ICD-10-CM | POA: Diagnosis not present

## 2022-02-16 DIAGNOSIS — E1140 Type 2 diabetes mellitus with diabetic neuropathy, unspecified: Secondary | ICD-10-CM | POA: Diagnosis not present

## 2022-02-16 DIAGNOSIS — N401 Enlarged prostate with lower urinary tract symptoms: Secondary | ICD-10-CM | POA: Diagnosis not present

## 2022-03-19 ENCOUNTER — Ambulatory Visit: Payer: Self-pay | Admitting: "Endocrinology

## 2022-03-29 DIAGNOSIS — Z6824 Body mass index (BMI) 24.0-24.9, adult: Secondary | ICD-10-CM | POA: Diagnosis not present

## 2022-03-29 DIAGNOSIS — K8689 Other specified diseases of pancreas: Secondary | ICD-10-CM | POA: Diagnosis not present

## 2022-03-29 DIAGNOSIS — E291 Testicular hypofunction: Secondary | ICD-10-CM | POA: Diagnosis not present

## 2022-03-29 DIAGNOSIS — N401 Enlarged prostate with lower urinary tract symptoms: Secondary | ICD-10-CM | POA: Diagnosis not present

## 2022-03-29 DIAGNOSIS — E1140 Type 2 diabetes mellitus with diabetic neuropathy, unspecified: Secondary | ICD-10-CM | POA: Diagnosis not present

## 2022-04-09 ENCOUNTER — Encounter: Payer: Self-pay | Admitting: Urology

## 2022-04-09 ENCOUNTER — Ambulatory Visit: Payer: BC Managed Care – PPO | Admitting: Urology

## 2022-04-09 VITALS — BP 133/91 | HR 81 | Ht 72.0 in | Wt 186.0 lb

## 2022-04-09 DIAGNOSIS — N138 Other obstructive and reflux uropathy: Secondary | ICD-10-CM

## 2022-04-09 DIAGNOSIS — N401 Enlarged prostate with lower urinary tract symptoms: Secondary | ICD-10-CM

## 2022-04-09 LAB — URINALYSIS, ROUTINE W REFLEX MICROSCOPIC
Bilirubin, UA: NEGATIVE
Glucose, UA: NEGATIVE
Ketones, UA: NEGATIVE
Leukocytes,UA: NEGATIVE
Nitrite, UA: NEGATIVE
RBC, UA: NEGATIVE
Specific Gravity, UA: >1.03 — ABNORMAL HIGH (ref 1.005–1.030)
Urobilinogen, Ur: 0.2 mg/dL (ref 0.2–1.0)
pH, UA: 5 (ref 5.0–7.5)

## 2022-04-09 LAB — BLADDER SCAN AMB NON-IMAGING

## 2022-04-09 NOTE — Progress Notes (Signed)
post void residual =18mL 

## 2022-04-09 NOTE — Progress Notes (Signed)
Assessment: 1. BPH with obstruction/lower urinary tract symptoms      Plan: I reviewed the patient's records from Dr. Gerarda Fraction. His increased urinary symptoms were likely due to poor control of his diabetes.  His symptoms have resolved and he has now at his baseline. Continue tamsulosin Return to office prn  Chief Complaint:  Chief Complaint  Patient presents with   Benign Prostatic Hypertrophy    History of Present Illness:  Jose Peters is a 61 y.o. year old male who is seen in consultation from [employer], MD for evaluation of lower urinary tract symptoms.  He has been on tamsulosin for approximately 1 year for mild BPH with lower urinary tract symptoms.  Approximately 2 weeks ago, he experienced significant increase in lower urinary tract symptoms with frequency, urgency, nocturia 5-6 times, urinary incontinence, intermittent stream, and hesitancy.  His diabetes was poorly controlled at this time.  He has since obtained excellent control of his sugars.  His urinary symptoms have resolved.  He is now at his baseline from a urinary standpoint.  No dysuria or gross hematuria.  He currently has nocturia x1. He reports some difficulty with erections during this episode.  He is currently able to achieve a adequate erection for intercourse and maintain his erection.  PSA from 5/23: 2.4   Past Medical History:  Past Medical History:  Diagnosis Date   Diabetes mellitus    Hypertension     Past Surgical History:  Past Surgical History:  Procedure Laterality Date   COLONOSCOPY  12/08/2011   Procedure: COLONOSCOPY;  Surgeon: Daneil Dolin, MD;  [facility];  Service: Endoscopy;  Laterality: N/A;  9:15 AM   COLONOSCOPY WITH PROPOFOL N/A 01/25/2022   Procedure: COLONOSCOPY WITH PROPOFOL;  Surgeon: Daneil Dolin, MD;  [facility];  Service: Endoscopy;  Laterality: N/A;  11:15 / ASA 2   ESOPHAGOGASTRODUODENOSCOPY (EGD) WITH PROPOFOL N/A 09/10/2021    Procedure: ESOPHAGOGASTRODUODENOSCOPY (EGD) WITH PROPOFOL;  Surgeon: Milus Banister, MD;  [facility];  Service: Endoscopy;  Laterality: N/A;   EUS N/A 09/10/2021   Procedure: UPPER ENDOSCOPIC ULTRASOUND (EUS) RADIAL;  Surgeon: Milus Banister, MD;  [facility];  Service: Endoscopy;  Laterality: N/A;   FINE NEEDLE ASPIRATION N/A 09/10/2021   Procedure: FINE NEEDLE ASPIRATION (FNA) LINEAR;  Surgeon: Milus Banister, MD;  [facility];  Service: Endoscopy;  Laterality: N/A;   I & D EXTREMITY Right 01/08/2020   Procedure: IRRIGATION AND DEBRIDEMENT Right/Hand Thumb;  Surgeon: Roseanne Kaufman, MD;  Location: [place];  Service: Orthopedics;  Laterality: Right;   NEUROLYSIS OF MEDIAN NERVE Right 01/08/2020   Procedure: Neuroplasty Repair of complex laceration;  Surgeon: Roseanne Kaufman, MD;  Location: [place];  Service: Orthopedics;  Laterality: Right;   PERCUTANEOUS PINNING Right 01/08/2020   Procedure: Open Repair Fracture Right Hand/Thumb;  Surgeon: Roseanne Kaufman, MD;  Location: [place];  Service: Orthopedics;  Laterality: Right;   POLYPECTOMY  01/25/2022   Procedure: POLYPECTOMY;  Surgeon: Daneil Dolin, MD;  [facility];  Service: Endoscopy;;    Allergies:  No Known Allergies  Family History:  No family history on file.  Social History:  Social History   Tobacco Use   Smoking status: Never   Smokeless tobacco: Never  Substance Use Topics   Alcohol use: No   Drug use: No    Review of symptoms:  Constitutional:  Negative for unexplained weight loss, night sweats, fever, chills ENT:  Negative for nose bleeds, sinus pain, painful swallowing CV:  Negative for chest pain, shortness of breath, exercise intolerance, palpitations, loss of consciousness Resp:  Negative for cough, wheezing, shortness of breath GI:  Negative for nausea, vomiting, diarrhea, bloody stools GU:  Positives noted in HPI; otherwise negative for gross hematuria,  dysuria Neuro:  Negative for seizures, poor balance, limb weakness, slurred speech Psych:  Negative for lack of energy, depression, anxiety Endocrine:  Negative for polydipsia, polyuria, symptoms of hypoglycemia (dizziness, hunger, sweating) Hematologic:  Negative for anemia, purpura, petechia, prolonged or excessive bleeding, use of anticoagulants  Allergic:  Negative for difficulty breathing or choking as a result of exposure to anything; no shellfish allergy; no allergic response (rash/itch) to materials, foods  Physical exam: BP (!) 133/91   Pulse 81   Ht 6' (1.829 m)   Wt 186 lb (84.4 kg)   BMI 25.23 kg/m  GENERAL APPEARANCE:  Well appearing, well developed, well nourished, NAD HEENT: Atraumatic, Normocephalic, oropharynx clear. NECK: Supple without lymphadenopathy or thyromegaly. LUNGS: Clear to auscultation bilaterally. HEART: Regular Rate and Rhythm without murmurs, gallops, or rubs. ABDOMEN: Soft, non-tender, No Masses. EXTREMITIES: Moves all extremities well.  Without clubbing, cyanosis, or edema. NEUROLOGIC:  Alert and oriented x 3, normal gait, CN II-XII grossly intact.  MENTAL STATUS:  Appropriate. BACK:  Non-tender to palpation.  No CVAT SKIN:  Warm, dry and intact.   GU: Penis:  circumcised Meatus: Normal Scrotum: normal, no masses Testis: normal without masses bilateral Epididymis: normal Prostate: 40 g, NT, no nodules Rectum: Normal tone,  no masses or tenderness   Results: U/A:  dipstick negative  PVR = 18 ml

## 2022-04-26 ENCOUNTER — Ambulatory Visit
Admission: EM | Admit: 2022-04-26 | Discharge: 2022-04-26 | Disposition: A | Discharge status: Home / Self Care | Payer: BC Managed Care – PPO | Attending: Nurse Practitioner | Admitting: Nurse Practitioner

## 2022-04-26 DIAGNOSIS — R21 Rash and other nonspecific skin eruption: Secondary | ICD-10-CM

## 2022-04-26 MED ORDER — TRIAMCINOLONE ACETONIDE 0.1 % EX CREA: 1.0000 | TOPICAL_CREAM | Freq: Two times a day (BID) | CUTANEOUS | 0 refills | Status: DC

## 2022-04-26 NOTE — Discharge Instructions (Signed)
Take medication as prescribed. May also take over-the-counter Zyrtec to help with itching during the daytime. Take Benadryl 1-2 tablets at bedtime for itching. Avoid hot baths or showers while symptoms persist.  Recommend taking lukewarm baths. May apply cool cloths to the area to help with itching or discomfort. Avoid scratching, rubbing, or manipulating the areas while symptoms persist. Recommend Aveeno colloidal oatmeal bath to use to help with drying and itching. Follow up if symptoms do not improve.

## 2022-04-26 NOTE — ED Triage Notes (Signed)
Pt presents with rash on arm that began yesterday , believes to be poison oak

## 2022-04-26 NOTE — ED Provider Notes (Signed)
[facility]    CSN: [id number] Arrival date & time: 04/26/22  1821      History   Chief Complaint Chief Complaint  Patient presents with   Rash    HPI Jose Peters is a 61 y.o. male.   The history is provided by the patient.   Patient presents for complaints of rash that is been present for the past 2 days.  Patient states rash is located on both of his forearms.  He states he was cutting down trees and since that time he has noticed a rash.  Patient is concerned that he may have come into contact with poison ivy.  Patient states rash is itchy.  It has not spread at this time.  He denies fever, chills, the use of new soaps, detergents, lotions, foods, or medications.  He has been taking Benadryl for his symptoms.  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    Past Surgical History:  Procedure Laterality Date   COLONOSCOPY  12/08/2011   Procedure: COLONOSCOPY;  Surgeon: Daneil Dolin, MD;  [facility];  Service: Endoscopy;  Laterality: N/A;  9:15 AM   COLONOSCOPY WITH PROPOFOL N/A 01/25/2022   Procedure: COLONOSCOPY WITH PROPOFOL;  Surgeon: Daneil Dolin, MD;  [facility];  Service: Endoscopy;  Laterality: N/A;  11:15 / ASA 2   ESOPHAGOGASTRODUODENOSCOPY (EGD) WITH PROPOFOL N/A 09/10/2021   Procedure: ESOPHAGOGASTRODUODENOSCOPY (EGD) WITH PROPOFOL;  Surgeon: Milus Banister, MD;  [facility];  Service: Endoscopy;  Laterality: N/A;   EUS N/A 09/10/2021   Procedure: UPPER ENDOSCOPIC ULTRASOUND (EUS) RADIAL;  Surgeon: Milus Banister, MD;  [facility];  Service: Endoscopy;  Laterality: N/A;   FINE NEEDLE ASPIRATION N/A 09/10/2021   Procedure: FINE NEEDLE ASPIRATION (FNA) LINEAR;  Surgeon: Milus Banister, MD;   [facility];  Service: Endoscopy;  Laterality: N/A;   I & D EXTREMITY Right 01/08/2020   Procedure: IRRIGATION AND DEBRIDEMENT Right/Hand Thumb;  Surgeon: Roseanne Kaufman, MD;  Location: [place];  Service: Orthopedics;  Laterality: Right;   NEUROLYSIS OF MEDIAN NERVE Right 01/08/2020   Procedure: Neuroplasty Repair of complex laceration;  Surgeon: Roseanne Kaufman, MD;  Location: [place];  Service: Orthopedics;  Laterality: Right;   PERCUTANEOUS PINNING Right 01/08/2020   Procedure: Open Repair Fracture Right Hand/Thumb;  Surgeon: Roseanne Kaufman, MD;  Location: [place];  Service: Orthopedics;  Laterality: Right;   POLYPECTOMY  01/25/2022   Procedure: POLYPECTOMY;  Surgeon: Daneil Dolin, MD;  [facility];  Service: Endoscopy;;       Home Medications    Prior to Admission medications   Medication Sig Start Date End Date Taking? Authorizing Provider  triamcinolone cream (KENALOG) 0.1 % Apply 1 Application topically 2 (two) times daily. 04/26/22  Yes Reneshia Zuccaro-Warren, Alda Lea, NP  amLODipine (NORVASC) 10 MG tablet Take 10 mg by mouth in the morning. 12/18/19   [provider]  escitalopram (LEXAPRO) 20 MG tablet Take 20 mg by mouth in the morning. 11/04/19   [provider]  JANUMET 50-1000 MG tablet Take 1 tablet by mouth 2 (two) times daily. 01/08/20   [provider]  losartan (COZAAR) 100 MG tablet Take 100 mg by mouth every evening. 11/04/19  [provider]  methocarbamol (ROBAXIN) 500 MG tablet Take 1 tablet (500 mg total) by mouth every 8 (eight) hours as needed for muscle spasms. 01/01/22   Heilingoetter, Cassandra L, PA-C  Multiple Vitamin (MULITIVITAMIN WITH MINERALS) TABS Take 1 tablet by mouth daily. men    [provider]  OZEMPIC, 1 MG/DOSE, 4 MG/3ML SOPN Inject 1 mg into the skin every Sunday. 07/29/21   [provider]  simvastatin (ZOCOR) 40 MG tablet Take 40 mg by mouth in the morning.    [provider]   tamsulosin (FLOMAX) 0.4 MG CAPS capsule Take 0.4 mg by mouth in the morning. 07/07/21   [provider]    Family History History reviewed. No pertinent family history.  Social History Social History   Tobacco Use   Smoking status: Never   Smokeless tobacco: Never  Substance Use Topics   Alcohol use: No   Drug use: No     Allergies   Patient has no known allergies.   Review of Systems Review of Systems HPI  Physical Exam Triage Vital Signs ED Triage Vitals  Enc Vitals Group     BP 04/26/22 1858 128/75     Pulse Rate 04/26/22 1858 83     Resp 04/26/22 1858 18     Temp 04/26/22 1858 98.6 F (37 C)     Temp src --      SpO2 04/26/22 1858 97 %     Weight --      Height --      Head Circumference --      Peak Flow --      Pain Score 04/26/22 1857 0     Pain Loc --      Pain Edu? --      Excl. in [place]? --    No data found.  Updated Vital Signs BP 128/75   Pulse 83   Temp 98.6 F (37 C)   Resp 18   SpO2 97%   Visual Acuity Right Eye Distance:   Left Eye Distance:   Bilateral Distance:    Right Eye Near:   Left Eye Near:    Bilateral Near:     Physical Exam Vitals and nursing note reviewed.  Constitutional:      General: He is not in acute distress.    Appearance: Normal appearance.  HENT:     Head: Normocephalic.  Pulmonary:     Effort: Pulmonary effort is normal.  Musculoskeletal:     Cervical back: Normal range of motion.  Skin:    General: Skin is warm and dry.     Findings: Erythema and rash present.     Comments: 2 erythematous indurations noted to the inner aspect of the right forearm.  Redness is noted to the inner aspect of the left forearm, there is no obvious induration or rash present to the left forearm.  Neurological:     General: No focal deficit present.     Mental Status: He is alert and oriented to person, place, and time.  Psychiatric:        Mood and Affect: Mood normal.        Behavior: Behavior normal.       UC Treatments / Results  Labs (all labs ordered are listed, but only abnormal results are displayed) Labs Reviewed - No data to display  EKG   Radiology No results found.  Procedures Procedures (including critical care time)  Medications Ordered in UC Medications - No  data to display  Initial Impression / Assessment and Plan / UC Course  I have reviewed the triage vital signs and the nursing notes.  Pertinent labs & imaging results that were available during my care of the patient were reviewed by me and considered in my medical decision making (see chart for details).  Patient presents for rash that started approximately 2 days ago after he reports he was cutting down a tree.  On exam, patient has 2 indurations to the right inner forearm, he has redness to the inner aspect of the left forearm.  Do not suspect poison ivy at this time; however this could change.  We will start patient on triamcinolone cream given his history of diabetes.  Supportive care recommendations were provided to the patient to include continuing the use of Benadryl for bedtime and taking Zyrtec at night.  Patient advised to follow-up if symptoms do not improve. Final Clinical Impressions(s) / UC Diagnoses   Final diagnoses:  Rash and nonspecific skin eruption     Discharge Instructions      Take medication as prescribed. May also take over-the-counter Zyrtec to help with itching during the daytime. Take Benadryl 1-2 tablets at bedtime for itching. Avoid hot baths or showers while symptoms persist.  Recommend taking lukewarm baths. May apply cool cloths to the area to help with itching or discomfort. Avoid scratching, rubbing, or manipulating the areas while symptoms persist. Recommend Aveeno colloidal oatmeal bath to use to help with drying and itching. Follow up if symptoms do not improve.      ED Prescriptions     Medication Sig Dispense Auth. Provider   triamcinolone cream (KENALOG)  0.1 % Apply 1 Application topically 2 (two) times daily. 30 g Ella Guillotte-Warren, Alda Lea, NP      PDMP not reviewed this encounter.   Tish Men, NP 04/26/22 2005

## 2022-11-11 DIAGNOSIS — E1140 Type 2 diabetes mellitus with diabetic neuropathy, unspecified: Secondary | ICD-10-CM | POA: Diagnosis not present

## 2022-11-11 DIAGNOSIS — Z1331 Encounter for screening for depression: Secondary | ICD-10-CM | POA: Diagnosis not present

## 2022-11-11 DIAGNOSIS — Z6825 Body mass index (BMI) 25.0-25.9, adult: Secondary | ICD-10-CM | POA: Diagnosis not present

## 2022-11-11 DIAGNOSIS — E663 Overweight: Secondary | ICD-10-CM | POA: Diagnosis not present

## 2022-11-11 DIAGNOSIS — F330 Major depressive disorder, recurrent, mild: Secondary | ICD-10-CM | POA: Diagnosis not present

## 2022-11-11 DIAGNOSIS — E782 Mixed hyperlipidemia: Secondary | ICD-10-CM | POA: Diagnosis not present

## 2022-11-11 DIAGNOSIS — C259 Malignant neoplasm of pancreas, unspecified: Secondary | ICD-10-CM | POA: Diagnosis not present

## 2022-11-11 DIAGNOSIS — Z0001 Encounter for general adult medical examination with abnormal findings: Secondary | ICD-10-CM | POA: Diagnosis not present

## 2022-11-11 DIAGNOSIS — E559 Vitamin D deficiency, unspecified: Secondary | ICD-10-CM | POA: Diagnosis not present

## 2022-11-11 DIAGNOSIS — Z125 Encounter for screening for malignant neoplasm of prostate: Secondary | ICD-10-CM | POA: Diagnosis not present

## 2022-11-11 DIAGNOSIS — Z1211 Encounter for screening for malignant neoplasm of colon: Secondary | ICD-10-CM | POA: Diagnosis not present

## 2022-11-11 DIAGNOSIS — I10 Essential (primary) hypertension: Secondary | ICD-10-CM | POA: Diagnosis not present

## 2022-11-11 LAB — HEMOGLOBIN A1C: Hemoglobin A1C: 12

## 2022-11-15 ENCOUNTER — Telehealth: Payer: Self-pay | Admitting: "Endocrinology

## 2022-11-15 NOTE — Telephone Encounter (Signed)
Lft vm for pt for referral for DM. Please sch with Dr Dorris Fetch. - referral under media/ referring provider, Hilma Favors

## 2022-12-16 ENCOUNTER — Encounter: Payer: Self-pay | Admitting: "Endocrinology

## 2022-12-17 IMAGING — PT NM PET SKULL BASE TO THIGH
7 series · 25 of 25 positions shown · non-contrast
Comparison: Outside CT 07/16/2021

CLINICAL DATA: Neuroendocrine tumor of the pancreas.

EXAM:
NUCLEAR MEDICINE PET SKULL BASE TO THIGH
TECHNIQUE: 4.1 mCi Ga 68/Cu 64 DOTATATE was injected intravenously. Full-ring
PET imaging was performed from the skull base to thigh after the
radiotracer. CT data was obtained and used for attenuation
correction and anatomic localization.

[Series 3: pet sk_thigh ac · axial · 5.0mm · 4.07mm/px · z∈[-788,+188]mm · 6 of 245 slices shown]
[im 1/245]
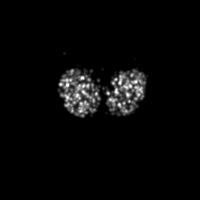
[im 49/245]
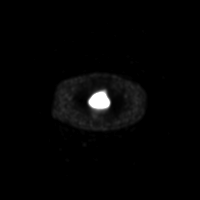
[im 98/245]
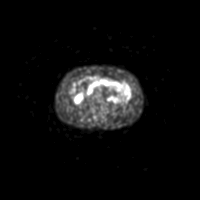
[im 147/245]
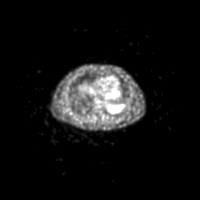
[im 196/245]
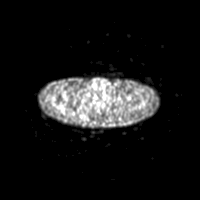
[im 245/245]
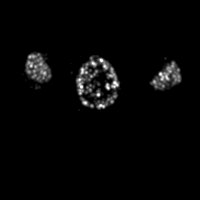

[Series 4: ct sk_thigh 5.0 br38 · axial · 5.0mm · 0.98mm/px · z∈[-788,+188]mm · 5 of 245 slices shown]
[im 1/245]
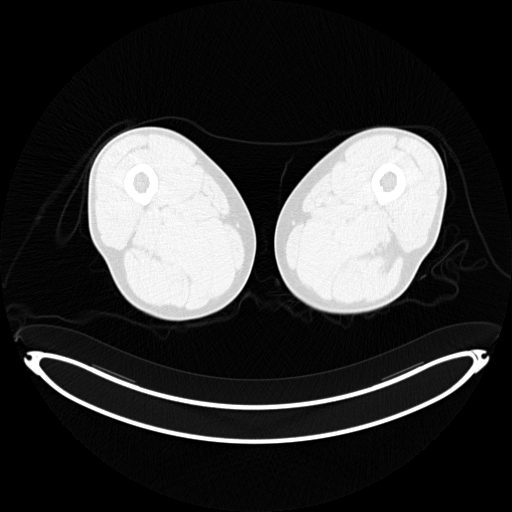
[im 62/245]
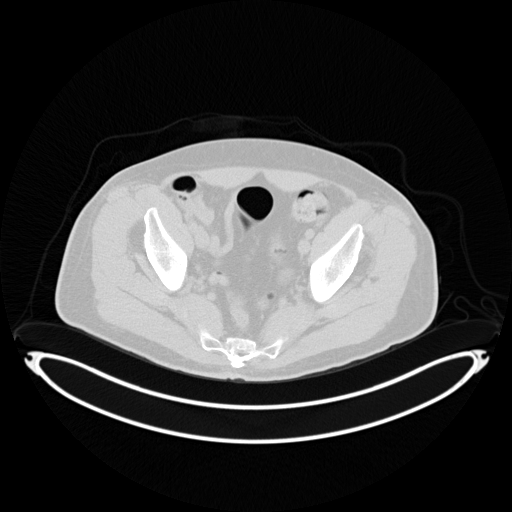
[im 123/245]
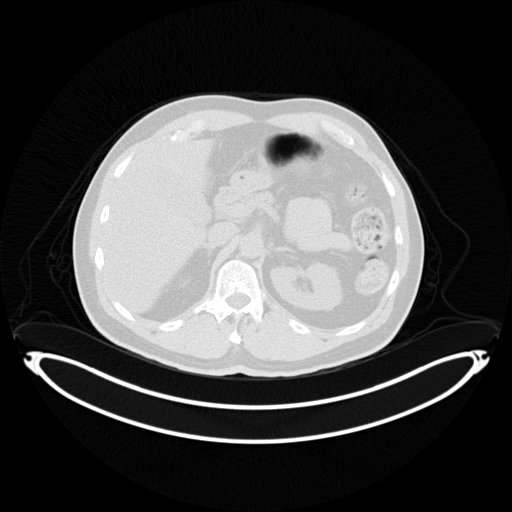
[im 184/245]
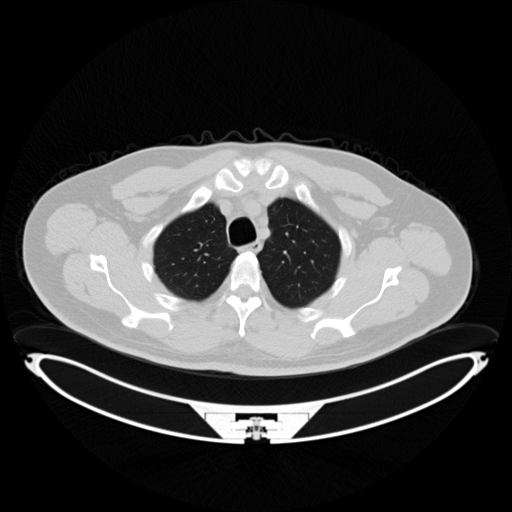
[im 245/245  brain]
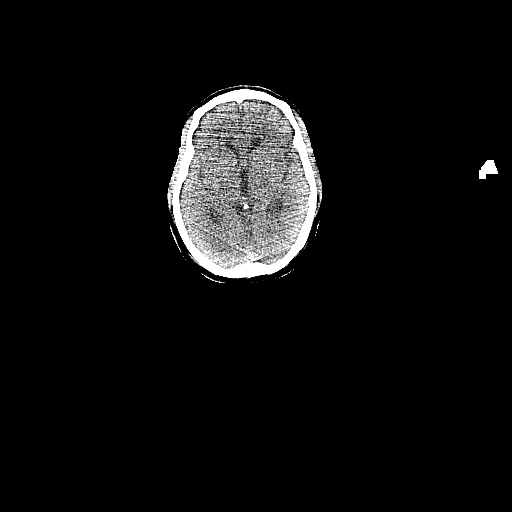

[Series 5: pet sk_thigh nac · axial · 5.0mm · 4.07mm/px · z∈[-788,+188]mm · 5 of 245 slices shown]
[im 1/245]
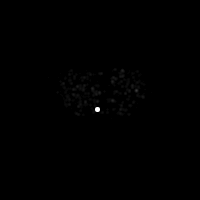
[im 62/245]
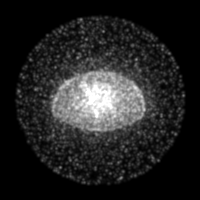
[im 123/245]
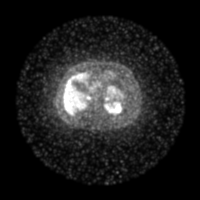
[im 184/245]
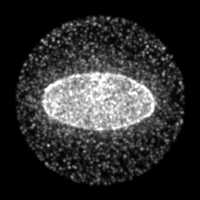
[im 245/245]
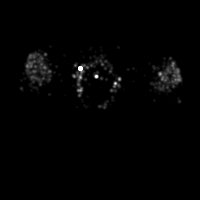

[Series 7: ct 5.0 bl57 (id)_bone · axial · 5.0mm · 0.63mm/px · z∈[-300,+8]mm · 2 of 78 slices shown]
[im 1/78]
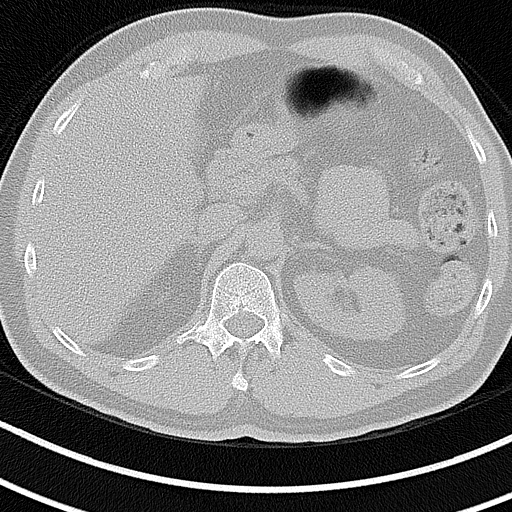
[im 78/78]
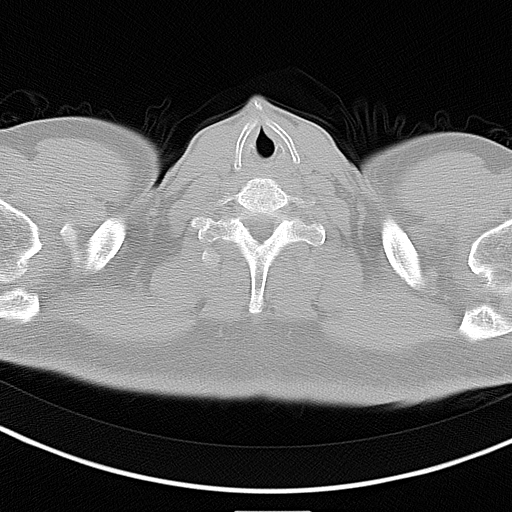

[Series 603: fused tra · 5 of 229 slices shown]
[im 1/229]
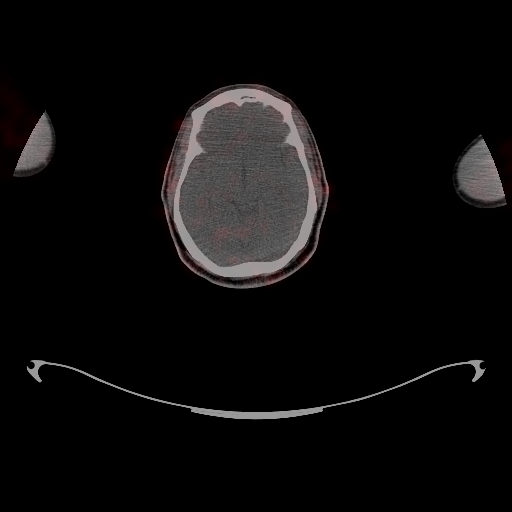
[im 58/229]
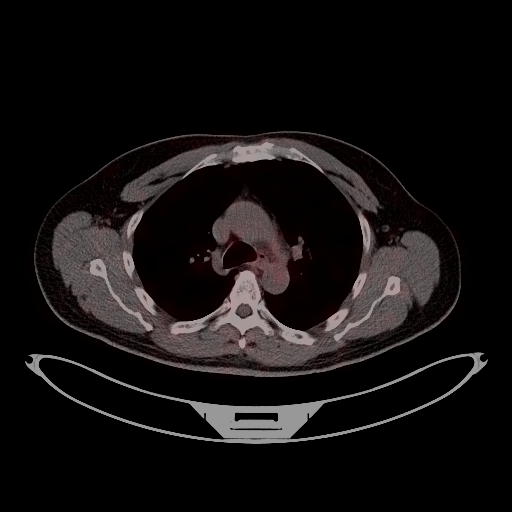
[im 115/229]
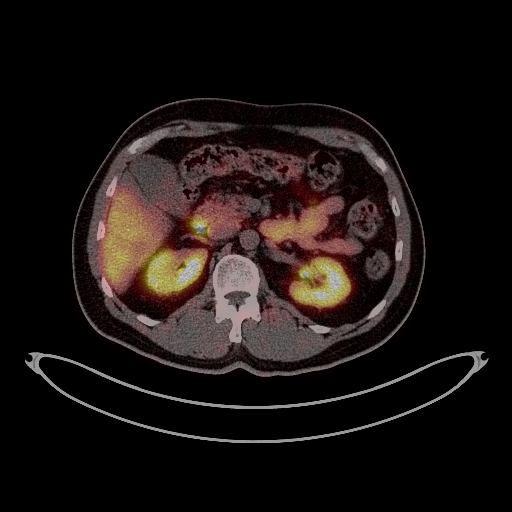
[im 172/229]
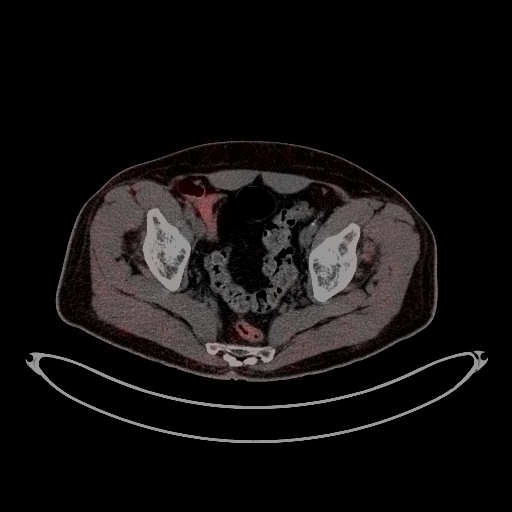
[im 229/229]
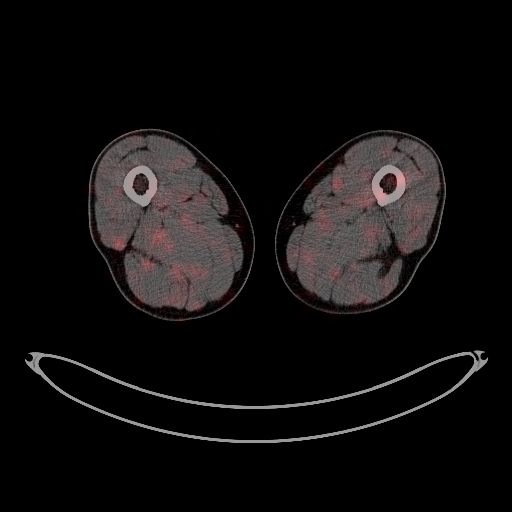

[Series 604: fused cor · 1 of 63 slices shown]
[im 1/63]
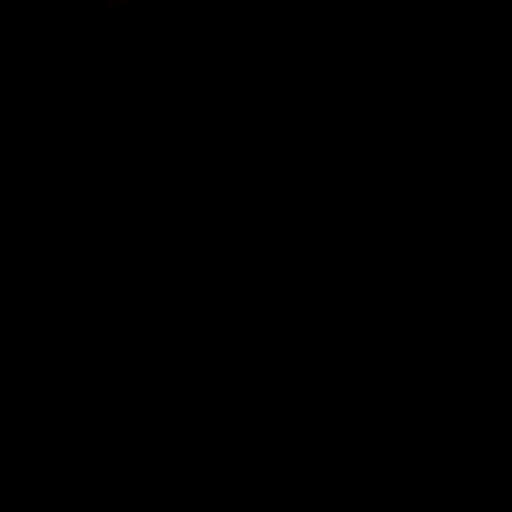

[Series 605: mip pet · coronal · 2.02mm/px · 1 of 32 slices shown]
[im 1/32]
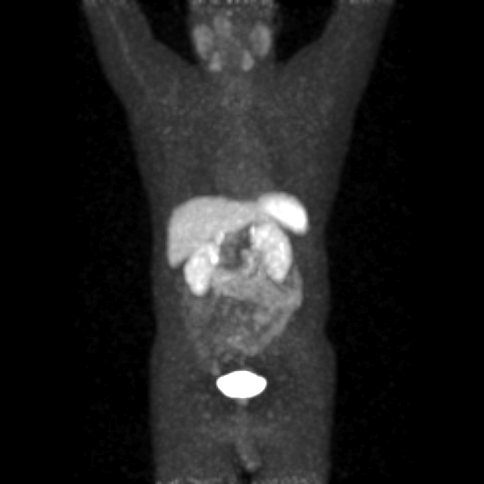

[25 of 25 positions shown; findings below may reference images not displayed]

FINDINGS: NECK

No radiotracer activity in neck lymph nodes.

Incidental CT findings: None

CHEST

No radiotracer accumulation within mediastinal or hilar lymph nodes.
No suspicious pulmonary nodules on the CT scan.

Incidental CT finding:None

ABDOMEN/PELVIS

Focus intense radiotracer activity just dorsal to the head of the
pancreas. There is small soft tissue lesion measuring 12 mm this
site with a punctate calcification (image 134/series 4). This
corresponds to a small hypervascular lesion on comparison CT. This
lesion is intensely radiotracer avid with SUV max equal 21.2 (image
133).

No additional abnormal activity within the pancreas. No radiotracer
avid upper abdominal lymph nodes.

No abnormal radiotracer activity in the liver.

No radiotracer avid or enlarged mesenteric nodes. No bowel lesion
identified.

Physiologic activity noted in the liver, spleen, adrenal glands and
kidneys.

Incidental CT findings:None

SKELETON

No focal activity to suggest skeletal metastasis.

Incidental CT findings:None
IMPRESSION: 1. Solitary well differentiated neuroendocrine tumor adjacent to the
head of the pancreas.
2. No evidence of metastatic well differentiated neuroendocrine
tumor.

## 2023-01-11 ENCOUNTER — Encounter: Payer: Self-pay | Admitting: "Endocrinology

## 2023-01-11 ENCOUNTER — Ambulatory Visit: Payer: BC Managed Care – PPO | Admitting: "Endocrinology

## 2023-01-11 VITALS — BP 118/78 | HR 80 | Ht 72.0 in | Wt 193.2 lb

## 2023-01-11 DIAGNOSIS — I10 Essential (primary) hypertension: Secondary | ICD-10-CM | POA: Diagnosis not present

## 2023-01-11 DIAGNOSIS — E782 Mixed hyperlipidemia: Secondary | ICD-10-CM | POA: Diagnosis not present

## 2023-01-11 DIAGNOSIS — E1165 Type 2 diabetes mellitus with hyperglycemia: Secondary | ICD-10-CM | POA: Diagnosis not present

## 2023-01-11 MED ORDER — ACCU-CHEK GUIDE VI STRP: ORAL_STRIP | 2 refills | Status: AC

## 2023-01-11 MED ORDER — ACCU-CHEK GUIDE ME W/DEVICE KIT: 1.0000 | PACK | 0 refills | Status: AC

## 2023-01-11 MED ORDER — FREESTYLE LIBRE 3 READER DEVI: 1.0000 | Freq: Once | 0 refills | Status: DC | PRN

## 2023-01-11 MED ORDER — FREESTYLE LIBRE 3 SENSOR MISC: 1.0000 | 2 refills | Status: DC

## 2023-01-11 NOTE — Patient Instructions (Signed)

## 2023-01-11 NOTE — Progress Notes (Signed)
Endocrinology Consult Note       01/11/2023, 3:44 PM   Subjective:    Patient ID: Jose Peters, male    DOB: 28-Aug-1961.  Jose Peters is being seen in consultation for management of currently uncontrolled symptomatic diabetes requested by  Elfredia Nevins, MD.   Past Medical History:  Diagnosis Date  . Diabetes mellitus   . Hypertension     Past Surgical History:  Procedure Laterality Date  . COLONOSCOPY  12/08/2011   Procedure: COLONOSCOPY;  Surgeon: Corbin Ade, MD;  [facility]: [facility];  Service: Endoscopy;  Laterality: N/A;  9:15 AM  . COLONOSCOPY WITH PROPOFOL N/A 01/25/2022   Procedure: COLONOSCOPY WITH PROPOFOL;  Surgeon: Corbin Ade, MD;  Location: [facility];  Service: Endoscopy;  Laterality: N/A;  11:15 / ASA 2  . ESOPHAGOGASTRODUODENOSCOPY (EGD) WITH PROPOFOL N/A 09/10/2021   Procedure: ESOPHAGOGASTRODUODENOSCOPY (EGD) WITH PROPOFOL;  Surgeon: Rachael Fee, MD;  Location: [facility];  Service: Endoscopy;  Laterality: N/A;  . EUS N/A 09/10/2021   Procedure: UPPER ENDOSCOPIC ULTRASOUND (EUS) RADIAL;  Surgeon: Rachael Fee, MD;  Location: [facility];  Service: Endoscopy;  Laterality: N/A;  . FINE NEEDLE ASPIRATION N/A 09/10/2021   Procedure: FINE NEEDLE ASPIRATION (FNA) LINEAR;  Surgeon: Rachael Fee, MD;  Location: [facility];  Service: Endoscopy;  Laterality: N/A;  . I & D EXTREMITY Right 01/08/2020   Procedure: IRRIGATION AND DEBRIDEMENT Right/Hand Thumb;  Surgeon: Dominica Severin, MD;  Location: [facility];  Service: Orthopedics;  Laterality: Right;  . NEUROLYSIS OF MEDIAN NERVE Right 01/08/2020   Procedure: Neuroplasty Repair of complex laceration;  Surgeon: Dominica Severin, MD;  Location: [facility];  Service: Orthopedics;  Laterality: Right;  . PERCUTANEOUS PINNING Right 01/08/2020   Procedure: Open Repair Fracture Right Hand/Thumb;  Surgeon: Dominica Severin, MD;   Location: [facility] OR;  Service: Orthopedics;  Laterality: Right;  . POLYPECTOMY  01/25/2022   Procedure: POLYPECTOMY;  Surgeon: Corbin Ade, MD;  [facility];  Service: Endoscopy;;    Social History   Socioeconomic History  . Marital status: Married    Spouse name: Not on file  . Number of children: Not on file  . Years of education: Not on file  . Highest education level: Not on file  Occupational History  . Not on file  Tobacco Use  . Smoking status: Never  . Smokeless tobacco: Never  Vaping Use  . Vaping Use: Never used  Substance and Sexual Activity  . Alcohol use: No  . Drug use: No  . Sexual activity: Not on file  Other Topics Concern  . Not on file  Social History Narrative  . Not on file   Social Determinants of Health   Financial Resource Strain: Not on file  Food Insecurity: Not on file  Transportation Needs: Not on file  Physical Activity: Not on file  Stress: Not on file  Social Connections: Not on file    History reviewed. No pertinent family history.  Outpatient Encounter Medications as of 01/11/2023  Medication Sig  . Blood Glucose Monitoring Suppl (ACCU-CHEK GUIDE ME) w/Device KIT 1  Piece by Does not apply route as directed.  . Continuous Glucose Receiver (FREESTYLE LIBRE 3 READER) DEVI 1 Piece by Does not apply route once as needed for up to 1 dose.  . Continuous Glucose Sensor (FREESTYLE LIBRE 3 SENSOR) MISC 1 Piece by Does not apply route every 14 (fourteen) days. Place 1 sensor on the skin every 14 days. Use to check glucose continuously  . glucose blood (ACCU-CHEK GUIDE) test strip Use to monitor glucose 4 times a day.  Marland Kitchen amLODipine (NORVASC) 10 MG tablet Take 10 mg by mouth in the morning.  . escitalopram (LEXAPRO) 20 MG tablet Take 20 mg by mouth in the morning.  Marland Kitchen JANUMET 50-1000 MG tablet Take 1 tablet by mouth 2 (two) times daily.  Marland Kitchen losartan (COZAAR) 100 MG tablet Take 100 mg by mouth every evening.  . Multiple Vitamin  (MULITIVITAMIN WITH MINERALS) TABS Take 1 tablet by mouth daily. men  . OZEMPIC, 1 MG/DOSE, 4 MG/3ML SOPN Inject 1 mg into the skin every Sunday.  . simvastatin (ZOCOR) 40 MG tablet Take 40 mg by mouth in the morning.  . tamsulosin (FLOMAX) 0.4 MG CAPS capsule Take 0.4 mg by mouth in the morning.  . [DISCONTINUED] methocarbamol (ROBAXIN) 500 MG tablet Take 1 tablet (500 mg total) by mouth every 8 (eight) hours as needed for muscle spasms.  . [DISCONTINUED] triamcinolone cream (KENALOG) 0.1 % Apply 1 Application topically 2 (two) times daily.   No facility-administered encounter medications on file as of 01/11/2023.    ALLERGIES: No Known Allergies  VACCINATION STATUS: Immunization History  Administered Date(s) Administered  . Tdap 01/08/2020    HPI   Review of Systems  Objective:       01/11/2023    9:02 AM 04/26/2022    6:58 PM 04/09/2022   11:40 AM  Vitals with BMI  Height 6\' 0"   6\' 0"   Weight 193 lbs 3 oz  186 lbs  BMI 26.2  25.22  Systolic 118 128 161  Diastolic 78 75 91  Pulse 80 83 81    BP 118/78   Pulse 80   Ht 6' (1.829 m)   Wt 193 lb 3.2 oz (87.6 kg)   BMI 26.20 kg/m   Wt Readings from Last 3 Encounters:  01/11/23 193 lb 3.2 oz (87.6 kg)  04/09/22 186 lb (84.4 kg)  01/25/22 193 lb (87.5 kg)     Physical Exam    CMP ( most recent) CMP     Component Value Date/Time   NA 140 01/01/2022 1047   K 4.3 01/01/2022 1047   CL 104 01/01/2022 1047   CO2 26 01/01/2022 1047   GLUCOSE 252 (H) 01/01/2022 1047   BUN 18 01/01/2022 1047   CREATININE 1.16 01/01/2022 1047   CALCIUM 10.5 (H) 01/01/2022 1047   PROT 8.1 01/01/2022 1047   ALBUMIN 4.6 01/01/2022 1047   AST 25 01/01/2022 1047   ALT 40 01/01/2022 1047   ALKPHOS 42 01/01/2022 1047   BILITOT 0.4 01/01/2022 1047   GFRNONAA >60 01/01/2022 1047   GFRAA >60 01/08/2020 1729     Diabetic Labs (most recent): Lab Results  Component Value Date   HGBA1C 12.0 11/11/2022     Lipid Panel ( most  recent) Lipid Panel  No results found for: "CHOL", "TRIG", "HDL", "CHOLHDL", "VLDL", "LDLCALC", "LDLDIRECT", "LABVLDL"    No results found for: "TSH", "FREET4"         Assessment & Plan:   1. Type 2 diabetes mellitus with  hyperglycemia, without long-term current use of insulin ***  2. Mixed hyperlipidemia ***  3. Essential hypertension, benign ***   - Jose Peters has currently uncontrolled symptomatic type 2 DM since  *** years of age,  with most recent A1c of *** %. Recent labs reviewed. - I had a long discussion with Jose Peters about the possible risk factors and  the pathology behind its diabetes and its complications. -Jose Peters diabetes is complicated by *** and Jose Peters remains at a high risk for more acute and chronic complications which include CAD, CVA, CKD, retinopathy, and neuropathy. These are all discussed in detail with Jose Peters.  - I discussed all available options of managing Jose Peters diabetes including de-escalation of medications. I have counseled Jose Peters on Food as Medicine by adopting a Whole Food , Plant Predominant  ( WFPP) nutrition as recommended by [employer] of Lifestyle Medicine. Patient is encouraged to switch to  unprocessed or minimally processed  complex starch, adequate protein intake (mainly plant source), minimal liquid fat, plenty of fruits, and vegetables. -  Jose Peters is advised to stick to a routine mealtimes to eat 3 complete meals a day and snack only when necessary ( to snack only to correct hypoglycemia BG <70 day time or <100 at night).   - Jose Peters acknowledges that there is a room for improvement in Jose Peters food and drink choices. - Further Specific Suggestion is made for Jose Peters to avoid simple carbohydrates  from Jose Peters diet including Cakes, Sweet Desserts, Ice Cream, Soda (diet and regular), Sweet Tea, Candies, Chips, Cookies, Store Bought Juices, Alcohol ,  Artificial Sweeteners,  Coffee Creamer, and "Sugar-free" Products. This will help patient to have more stable blood glucose  profile and potentially avoid unintended weight gain.  The following Lifestyle Medicine recommendations according to American College of Lifestyle Medicine [facility]) were discussed and offered to patient and Jose Peters agrees to start the journey:  A.  Whole Foods, Plant-based plate comprising of fruits and vegetables, plant-based proteins, whole-grain carbohydrates was discussed in detail with the patient.   A list for source of those nutrients were also provided to the patient.  Patient will use only water or unsweetened tea for hydration. B.  The need to stay away from risky substances including alcohol, smoking; obtaining 7 to 9 hours of restorative sleep, at least 150 minutes of moderate intensity exercise weekly, the importance of healthy social connections,  and stress reduction techniques were discussed. C.  A full color page of  Calorie density of various food groups per pound showing examples of each food groups was provided to the patient.  - Jose Peters will be scheduled with Norm Salt, RDN, CDE for individualized diabetes education.  - I have approached Jose Peters with the following individualized plan to manage  Jose Peters diabetes and patient agrees:   - Jose Peters will be initiated on basal insulin *** units daily at bedtime , and prandial insulin *** units 3 times a day with meals  for pre-meal BG readings of 90-150mg /dl, plus patient specific correction dose for unexpected hyperglycemia above /dl, associated with strict monitoring of glucose 4 times a day-before meals and at bedtime. - Jose Peters is warned not to take insulin without proper monitoring per orders. - Adjustment parameters are given to Jose Peters for hypo and hyperglycemia in writing. - Jose Peters is encouraged to call clinic for blood glucose levels less than 70 or above 300 mg /dl. - Jose Peters is advised to continue ***, therapeutically suitable for patient . - Jose Peters *** will be discontinued, risk outweighs  benefit for this patient. - Jose Peters is not a candidate for *** due to  concurrent renal insufficiency.  - Jose Peters will be considered for incretin therapy as appropriate next visit.  - Specific targets for  A1c;  LDL, HDL,  and Triglycerides were discussed with the patient.  2) Blood Pressure /Hypertension:  Jose Peters blood pressure is *** controlled to target.   Jose Peters is advised to continue Jose Peters current medications including ***  mg p.o. daily with breakfast . 3) Lipids/Hyperlipidemia:   Review of Jose Peters recent lipid panel showed *** controlled  LDL at *** .  Jose Peters  is advised to continue    *** mg daily at bedtime.  Side effects and precautions discussed with Jose Peters.  4)  Weight/Diet:  Body mass index is 26.2 kg/m.  -  *** clearly complicating Jose Peters diabetes care.   Jose Peters is *** a candidate for weight loss. I discussed with Jose Peters the fact that loss of 5 - 10% of Jose Peters  current body weight will have the most impact on Jose Peters diabetes management.  The above detailed  ACLM recommendations for nutrition, exercise, sleep, social life, avoidance of risky substances, the need for restorative sleep   information will also detailed on discharge instructions.  5) Chronic Care/Health Maintenance:  -Jose Peters  *** on ACEI/ARB and Statin medications and  is encouraged to initiate and continue to follow up with Ophthalmology, Dentist,  Podiatrist at least yearly or according to recommendations, and advised to  *** stay away from smoking. I have recommended yearly flu vaccine and pneumonia vaccine at least every 5 years; moderate intensity exercise for up to 150 minutes weekly; and  sleep for 7- 9 hours a day.  - Jose Peters is  advised to maintain close follow up with Elfredia Nevins, MD for primary care needs, as well as Jose Peters other providers for optimal and coordinated care.   I spent 6*** minutes in the care of the patient today including review of labs from CMP, Lipids, Thyroid Function, Hematology (current and previous including abstractions from other facilities); face-to-face time discussing  Jose Peters blood glucose readings/logs,  discussing hypoglycemia and hyperglycemia episodes and symptoms, medications doses, Jose Peters options of short and long term treatment based on the latest standards of care / guidelines;  discussion about incorporating lifestyle medicine;  and documenting the encounter. Risk reduction counseling performed per USPSTF guidelines to reduce *** obesity and cardiovascular risk factors.      Please refer to Patient Instructions for Blood Glucose Monitoring and Insulin/Medications Dosing Guide"  in media tab for additional information. Please  also refer to " Patient Self Inventory" in the Media  tab for reviewed elements of pertinent patient history.  Jose Peters participated in the discussions, expressed understanding, and voiced agreement with the above plans.  All questions were answered to Jose Peters satisfaction. Jose Peters is encouraged to contact clinic should Jose Peters have any questions or concerns prior to Jose Peters return visit.   Follow up plan: - Return in about 10 days (around 01/21/2023) for F/U with Meter/CGM /Logs Only - no Labs.  Marquis Lunch, MD [facility] [address] [place], [place] [address] Phone: [phone]  Fax: [phone]    01/11/2023, 3:44 PM  This note was partially dictated with voice recognition software. Similar sounding words can be transcribed inadequately or may not  be corrected upon review.

## 2023-01-18 ENCOUNTER — Other Ambulatory Visit: Payer: Self-pay

## 2023-01-18 ENCOUNTER — Other Ambulatory Visit (HOSPITAL_BASED_OUTPATIENT_CLINIC_OR_DEPARTMENT_OTHER): Payer: Self-pay

## 2023-01-18 MED ORDER — OZEMPIC (2 MG/DOSE) 8 MG/3ML ~~LOC~~ SOPN
2.0000 mg | PEN_INJECTOR | SUBCUTANEOUS | 0 refills | Status: AC
  Filled 2023-01-18: qty 3, 28d supply, fill #0

## 2023-01-31 ENCOUNTER — Ambulatory Visit: Payer: BC Managed Care – PPO | Admitting: "Endocrinology

## 2023-02-03 ENCOUNTER — Telehealth: Payer: Self-pay | Admitting: Medical Oncology

## 2023-02-03 NOTE — Telephone Encounter (Signed)
MTG-015 - Tissue and Bodily Fluids: Translational Medicine: Discovery and Evaluation of Biomarkers/Pharmacogenomics for the Diagnosis and Personalized Management of Patients    Outgoing call: [phone] (One year study follow-up)  Call to patient regarding the study one year follow-up assessment. I introduced myself to patient and explained the nature of this call. Inquired with patient if he had a few minutes to complete this one year assessment. Patient stated that he was heading out the door. We made plans for me to call patient back tomorrow before noon. Patient thanked.   [name], RN, BSN, [facility] Clinical Research Nurse Lead 02/03/2023 2:26 PM

## 2023-02-04 ENCOUNTER — Telehealth: Payer: Self-pay | Admitting: Medical Oncology

## 2023-02-04 NOTE — Telephone Encounter (Signed)
MTG-015 - Tissue and Bodily Fluids: Translational Medicine: Discovery and Evaluation of Biomarkers/Pharmacogenomics for the Diagnosis and Personalized Management of Patients    Incoming call: 12:10 (One year Follow-Up)  Patient returned my call regarding study follow-up and confirmed he had time to complete this final study assessment.    Relapse/Progressive Disease History: Patient confirms no relapse or progression or new cancer diagnosis, that he is aware of. Patient states that he had called Dr. Eliot Ford office to schedule an appointment for a scan, since it has been a year.    Medical Conditions: Patient's medical history reviewed and patient states no resolution to previously reported medical conditions and confirms no new issues or diagnosis.    Medications: Patient and I reviewed his medications and any changes, one by one. Patient confirms his current medication list is correct. Patient has a multi-vitamin listed on his medications, states he started that "about a year ago."    Patient was informed that this was the study's final assessment and that his study participation is now completed. Patient informed that the study provides a $50 gift card for his time and this nurse inquired how he wishes to receive this gift card. Patient informed we can mail it to him or he may pick it up at the clinic at his convenience. Patient was informed, should the card get lost in the mail, we are unable to replace it since the study only provides one gift card per patient at completion of study. Patient gave his verbal understanding and stated he would prefer to pick it pick up at the [facility], and will be picking it up this afternoon. Instructions for where to pick up and to ask for research specialist, Adella Nissen, were provided.  Patient denied having any questions. Patient was thanked for his time and his contribution to the study and was encouraged to call research with any questions he may  have.  I informed patient that I will send a message to Dr. Freida Busman  or Dr. Asa Lente office for scheduling him for a follow-up. Patient expressed appreciation for this.   [name], RN, BSN, [facility] Clinical Research Nurse Lead 02/04/2023 12:45 PM

## 2023-02-04 NOTE — Telephone Encounter (Signed)
MTG-015 - Tissue and Bodily Fluids: Translational Medicine: Discovery and Evaluation of Biomarkers/Pharmacogenomics for the Diagnosis and Personalized Management of Patients    Outgoing call: [phone] (One year study follow-up)  LVMOM with patient regarding the above study. Asked patient to return call at earliest convenience. Patient thanked.   [name], RN, BSN, [facility] Clinical Research Nurse Lead 02/04/2023 11:32 AM

## 2023-02-15 ENCOUNTER — Telehealth: Payer: Self-pay

## 2023-02-15 NOTE — Telephone Encounter (Signed)
This nurse reached out to patient and spoke with patients wife and made her aware that Dr. Eliot Ford office will reach out to make an office appointment and to order a CT scan.  She acknowledged understanding and has no further questions or concerns at this time.

## 2023-02-17 ENCOUNTER — Telehealth: Payer: Self-pay

## 2023-02-17 ENCOUNTER — Other Ambulatory Visit (HOSPITAL_COMMUNITY): Payer: Self-pay

## 2023-02-17 NOTE — Telephone Encounter (Signed)
Patient Advocate Encounter   Received notification from [facility] that prior authorization is required for FreeStyle Libre 3 sensor  Submitted: 02/17/23 Key [id number]  Status is pending

## 2023-02-17 NOTE — Telephone Encounter (Signed)
Patient Advocate Encounter   Received notification from pt msgs that prior authorization is required for Accu-Chek Guide strips  Submitted: 02/17/23 Key [id number]  Status is pending

## 2023-02-25 NOTE — Telephone Encounter (Signed)
PA has been DENIED due to:  Per your health plan's criteria, this product is covered if you meet the following: (1) You are following a treatment plan and an education program on your condition (current diabetes treatment plan and diabetes education and support). (2) One of the following: (A) There are paid claims or your doctor provides medical records (for example: chart notes) showing you are being treated with insulin. (B) Your doctor provides medical records (for example: chart notes) showing that you have a history of low blood sugar problems (problematic hypoglycemia) with at least one of the following: (I) More than one (recurrent) level 2 low blood sugar event [hypoglycemic events where glucose less than 54mg /dL (6.9GEXB/M)] even with multiple (more than one) attempts to adjust drug(s) and/or change the diabetes treatment plan. (II) History of one level 3 low blood sugar event [hypoglycemic event where glucose less than 54mg /dL (8.4XLKG/M)] as seen by changes in your mental and/or physical state where you need help for treatment of low blood sugar levels (third-party assistance for the treatment of hypoglycemia). (3) There are paid claims or your doctor provides medical records (for example: chart notes) showing you have tried at least a 90-day supply of Dexcom products within the last 180 days. The information provided does not show that you meet the criteria listed above. Please speak with your doctor about your choices.

## 2023-02-25 NOTE — Telephone Encounter (Signed)
PA has been DENIED due to:  The denial was based on our criteria for Accu-Chek Tes Guide.  Per your health plan's criteria, this product is covered if you meet the following: The doctor submits medical records (for example: chart notes) confirming that the requested test strip is required because it is the only product that will work (interface) with your insulin pump. The information provided does not show that you meet the criteria listed above.

## 2023-03-01 ENCOUNTER — Other Ambulatory Visit: Payer: Self-pay | Admitting: Surgery

## 2023-03-01 DIAGNOSIS — D3A8 Other benign neuroendocrine tumors: Secondary | ICD-10-CM

## 2023-03-02 ENCOUNTER — Telehealth: Payer: Self-pay

## 2023-03-02 NOTE — Telephone Encounter (Signed)
Left a message requesting pt to return call to the office 

## 2023-03-03 ENCOUNTER — Other Ambulatory Visit: Payer: Self-pay

## 2023-03-03 DIAGNOSIS — E1165 Type 2 diabetes mellitus with hyperglycemia: Secondary | ICD-10-CM

## 2023-03-03 MED ORDER — DEXCOM G7 RECEIVER DEVI: 0 refills | Status: AC

## 2023-03-03 MED ORDER — DEXCOM G7 SENSOR MISC: 2 refills | Status: AC

## 2023-03-09 ENCOUNTER — Ambulatory Visit
Admission: RE | Admit: 2023-03-09 | Discharge: 2023-03-09 | Disposition: A | Discharge status: Home / Self Care | Payer: BC Managed Care – PPO | Source: Ambulatory Visit | Attending: Surgery | Admitting: Surgery

## 2023-03-09 DIAGNOSIS — D136 Benign neoplasm of pancreas: Secondary | ICD-10-CM | POA: Diagnosis not present

## 2023-03-09 DIAGNOSIS — D3A8 Other benign neuroendocrine tumors: Secondary | ICD-10-CM | POA: Diagnosis not present

## 2023-03-09 MED ORDER — IOPAMIDOL (ISOVUE-300) INJECTION 61%
100.0000 mL | Freq: Once | INTRAVENOUS | Status: AC | PRN
  Administered 2023-03-09: 100 mL via INTRAVENOUS

## 2023-03-15 DIAGNOSIS — D3A8 Other benign neuroendocrine tumors: Secondary | ICD-10-CM | POA: Diagnosis not present

## 2023-11-11 DIAGNOSIS — E1165 Type 2 diabetes mellitus with hyperglycemia: Secondary | ICD-10-CM | POA: Diagnosis not present

## 2023-11-11 DIAGNOSIS — N401 Enlarged prostate with lower urinary tract symptoms: Secondary | ICD-10-CM | POA: Diagnosis not present

## 2023-11-11 DIAGNOSIS — Z6824 Body mass index (BMI) 24.0-24.9, adult: Secondary | ICD-10-CM | POA: Diagnosis not present

## 2023-11-11 DIAGNOSIS — E7849 Other hyperlipidemia: Secondary | ICD-10-CM | POA: Diagnosis not present

## 2023-11-11 DIAGNOSIS — C259 Malignant neoplasm of pancreas, unspecified: Secondary | ICD-10-CM | POA: Diagnosis not present

## 2023-11-11 DIAGNOSIS — I10 Essential (primary) hypertension: Secondary | ICD-10-CM | POA: Diagnosis not present

## 2023-11-11 DIAGNOSIS — E1140 Type 2 diabetes mellitus with diabetic neuropathy, unspecified: Secondary | ICD-10-CM | POA: Diagnosis not present

## 2023-11-11 DIAGNOSIS — E782 Mixed hyperlipidemia: Secondary | ICD-10-CM | POA: Diagnosis not present

## 2023-11-18 DIAGNOSIS — M5416 Radiculopathy, lumbar region: Secondary | ICD-10-CM | POA: Diagnosis not present

## 2023-11-18 DIAGNOSIS — I10 Essential (primary) hypertension: Secondary | ICD-10-CM | POA: Diagnosis not present

## 2023-11-18 DIAGNOSIS — E1140 Type 2 diabetes mellitus with diabetic neuropathy, unspecified: Secondary | ICD-10-CM | POA: Diagnosis not present

## 2023-11-18 DIAGNOSIS — C259 Malignant neoplasm of pancreas, unspecified: Secondary | ICD-10-CM | POA: Diagnosis not present

## 2023-11-18 DIAGNOSIS — M4306 Spondylolysis, lumbar region: Secondary | ICD-10-CM | POA: Diagnosis not present

## 2023-11-18 DIAGNOSIS — E1165 Type 2 diabetes mellitus with hyperglycemia: Secondary | ICD-10-CM | POA: Diagnosis not present

## 2023-11-18 DIAGNOSIS — Z6824 Body mass index (BMI) 24.0-24.9, adult: Secondary | ICD-10-CM | POA: Diagnosis not present

## 2023-12-19 DIAGNOSIS — M549 Dorsalgia, unspecified: Secondary | ICD-10-CM | POA: Diagnosis not present

## 2023-12-26 DIAGNOSIS — M62551 Muscle wasting and atrophy, not elsewhere classified, right thigh: Secondary | ICD-10-CM | POA: Diagnosis not present

## 2023-12-26 DIAGNOSIS — M2569 Stiffness of other specified joint, not elsewhere classified: Secondary | ICD-10-CM | POA: Diagnosis not present

## 2023-12-26 DIAGNOSIS — M546 Pain in thoracic spine: Secondary | ICD-10-CM | POA: Diagnosis not present

## 2023-12-26 DIAGNOSIS — M5416 Radiculopathy, lumbar region: Secondary | ICD-10-CM | POA: Diagnosis not present

## 2024-01-02 DIAGNOSIS — M2569 Stiffness of other specified joint, not elsewhere classified: Secondary | ICD-10-CM | POA: Diagnosis not present

## 2024-01-02 DIAGNOSIS — M5416 Radiculopathy, lumbar region: Secondary | ICD-10-CM | POA: Diagnosis not present

## 2024-01-02 DIAGNOSIS — M62551 Muscle wasting and atrophy, not elsewhere classified, right thigh: Secondary | ICD-10-CM | POA: Diagnosis not present

## 2024-01-02 DIAGNOSIS — M546 Pain in thoracic spine: Secondary | ICD-10-CM | POA: Diagnosis not present

## 2024-03-28 DIAGNOSIS — Z6824 Body mass index (BMI) 24.0-24.9, adult: Secondary | ICD-10-CM | POA: Diagnosis not present

## 2024-03-28 DIAGNOSIS — M4306 Spondylolysis, lumbar region: Secondary | ICD-10-CM | POA: Diagnosis not present

## 2024-03-28 DIAGNOSIS — M5412 Radiculopathy, cervical region: Secondary | ICD-10-CM | POA: Diagnosis not present

## 2024-03-28 DIAGNOSIS — R7989 Other specified abnormal findings of blood chemistry: Secondary | ICD-10-CM | POA: Diagnosis not present

## 2024-03-28 DIAGNOSIS — M47812 Spondylosis without myelopathy or radiculopathy, cervical region: Secondary | ICD-10-CM | POA: Diagnosis not present

## 2024-03-28 DIAGNOSIS — E1165 Type 2 diabetes mellitus with hyperglycemia: Secondary | ICD-10-CM | POA: Diagnosis not present

## 2024-03-28 DIAGNOSIS — E1140 Type 2 diabetes mellitus with diabetic neuropathy, unspecified: Secondary | ICD-10-CM | POA: Diagnosis not present

## 2024-03-28 DIAGNOSIS — I10 Essential (primary) hypertension: Secondary | ICD-10-CM | POA: Diagnosis not present

## 2024-04-26 DIAGNOSIS — E782 Mixed hyperlipidemia: Secondary | ICD-10-CM | POA: Diagnosis not present

## 2024-04-26 DIAGNOSIS — E1140 Type 2 diabetes mellitus with diabetic neuropathy, unspecified: Secondary | ICD-10-CM | POA: Diagnosis not present

## 2024-05-27 DIAGNOSIS — E1140 Type 2 diabetes mellitus with diabetic neuropathy, unspecified: Secondary | ICD-10-CM | POA: Diagnosis not present

## 2024-05-27 DIAGNOSIS — E782 Mixed hyperlipidemia: Secondary | ICD-10-CM | POA: Diagnosis not present

## 2024-06-05 ENCOUNTER — Ambulatory Visit (INDEPENDENT_AMBULATORY_CARE_PROVIDER_SITE_OTHER): Admitting: Family Medicine

## 2024-06-05 ENCOUNTER — Encounter: Payer: Self-pay | Admitting: Family Medicine

## 2024-06-05 VITALS — BP 150/102 | HR 100 | Temp 98.0°F | Ht 72.0 in | Wt 180.0 lb

## 2024-06-05 DIAGNOSIS — Z7689 Persons encountering health services in other specified circumstances: Secondary | ICD-10-CM

## 2024-06-05 DIAGNOSIS — F419 Anxiety disorder, unspecified: Secondary | ICD-10-CM

## 2024-06-05 DIAGNOSIS — I10 Essential (primary) hypertension: Secondary | ICD-10-CM

## 2024-06-05 DIAGNOSIS — Z23 Encounter for immunization: Secondary | ICD-10-CM

## 2024-06-05 DIAGNOSIS — E782 Mixed hyperlipidemia: Secondary | ICD-10-CM | POA: Diagnosis not present

## 2024-06-05 DIAGNOSIS — E1165 Type 2 diabetes mellitus with hyperglycemia: Secondary | ICD-10-CM | POA: Diagnosis not present

## 2024-06-05 MED ORDER — ESCITALOPRAM OXALATE 20 MG PO TABS: 20.0000 mg | ORAL_TABLET | Freq: Every day | ORAL | 1 refills | Status: AC

## 2024-06-05 NOTE — Progress Notes (Signed)
 Patient Office Visit  Assessment & Plan:  Encounter to establish care  Immunization due -     Flu vaccine HIGH DOSE PF(Fluzone Trivalent)  Mixed hyperlipidemia -     Lipid panel  Essential hypertension, benign -     CBC with Differential/Platelet -     Comprehensive metabolic panel with GFR  Type 2 diabetes mellitus with hyperglycemia, without long-term current use of insulin  (HCC) -     Hemoglobin A1c -     Microalbumin / creatinine urine ratio  Anxiety -     Escitalopram  Oxalate; Take 1 tablet (20 mg total) by mouth daily.  Dispense: 90 tablet; Refill: 1  Need for pneumococcal 20-valent conjugate vaccination -     Pneumococcal conjugate vaccine 20-valent   Assessment and Plan    Type 2 diabetes mellitus with hyperglycemia Fluctuating blood glucose levels from 189 to 300 mg/dL. A1c outdated, requiring updated assessment for glycemic control. - Order blood work: kidney function, liver function, glucose, cholesterol, protein, urine analysis. - Continue Ozempic  and Janumet.   Essential hypertension Elevated blood pressure likely due to missed antihypertensive doses. On losartan and amlodipine. - Advise adherence to losartan and amlodipine regimen. - Re-evaluate blood pressure in follow-up.  Mixed hyperlipidemia Managed with simvastatin. No recent lipid panel to assess control. - Order blood work for cholesterol levels.  Anxiety disorder Increased anxiety symptoms, possibly due to retirement and family demands. Previously on Lexapro . - Prescribe Lexapro .  General Health Maintenance Due for flu and pneumonia vaccines. Colonoscopy due in five years. Tetanus up to date. Shingles vaccine not administered. - Administer flu and pneumonia vaccines. - Plan shingles vaccine series next visit. - Schedule colonoscopy in five years.      Test results were reviewed and analyzed as part of the medical decision making of this visit. Flu shot and pneumococcal vaccine given  today Recommend healthy diet i.e mediterranean/DASH diet, consistent exercise - 30 minutes 5 day per week, and gradual weight loss. Return in about 4 weeks (around 07/03/2024), or if symptoms worsen or fail to improve, for hypertension, hyperlipidemia, type 2 diabetes.   Subjective:    Patient ID: Jose Peters, male    DOB: 05/01/1961  Age: 63 y.o. MRN: [id number]  No chief complaint on file.   HPI Discussed the use of AI scribe software for clinical note transcription with the patient, who gave verbal consent to proceed.  History of Present Illness        Jose Peters is a 63 year old male who presents for routine follow-up/establish primary care here and vaccinations. He is accompanied by his wife.  He has not had blood work done in over a year, and his last eye exam was last year. He is due for his next eye exam soon. He has not received the shingles or pneumonia vaccines yet. Patient would like to receive the flu shot today.   He has a history of hypertension and is currently taking losartan and amlodipine. He did not take his blood pressure medications today. He has been on these medications for a long time.  He also has hyperlipidemia and is taking simvastatin. He has been on this medication for a long time as well. He eats a healthy diet for the most part.   He has diabetes and his blood sugar fluctuates, sometimes reaching up to 300 mg/dL, but typically around 810 mg/dL. He has not had an A1c test in over a year. He is on Ozempic  and Janumet for  diabetes management.  He experiences anxiety and tension in his neck due to family demands since retiring four weeks ago. He was previously on Lexapro  for anxiety but is not currently taking it. His wife believes he should continue with it.  He denies smoking, alcohol, or drug use. He exercises by walking three times a week. His weight has been stable and he maintains a diet that includes home-cooked meals and occasional dining out at  places like [facility]. Physical Exam Results LABS Continuous Blood Glucose Monitor: 189-300 mg/dL  DIAGNOSTIC Colonoscopy: One polyp Assessment & Plan Type 2 diabetes mellitus with hyperglycemia Fluctuating blood glucose levels from 189 to 300 mg/dL. A1c outdated, requiring updated assessment for glycemic control. - Order blood work: kidney function, liver function, glucose, cholesterol, protein, urine analysis. - Continue Ozempic  and Janumet.   Essential hypertension Elevated blood pressure likely due to missed antihypertensive doses. On losartan and amlodipine. - Advise adherence to losartan and amlodipine regimen. - Re-evaluate blood pressure in follow-up.  Mixed hyperlipidemia Managed with simvastatin. No recent lipid panel to assess control. - Order blood work for cholesterol levels.  Anxiety disorder Increased anxiety symptoms, possibly due to retirement and family demands. Previously on Lexapro . - Prescribe Lexapro .  General Health Maintenance Due for flu and pneumonia vaccines. Colonoscopy due in five years. Tetanus up to date. Shingles vaccine not administered. - Administer flu and pneumonia vaccines. - Plan shingles vaccine series next visit. - Schedule colonoscopy in five years.     The ASCVD Risk score (Arnett DK, et al., 2019) failed to calculate for the following reasons:   Cannot find a previous HDL lab   Cannot find a previous total cholesterol lab  Past Medical History:  Diagnosis Date   Diabetes mellitus    Hypertension    Past Surgical History:  Procedure Laterality Date   COLONOSCOPY  12/08/2011   Procedure: COLONOSCOPY;  Surgeon: Lamar CHRISTELLA Hollingshead, MD;  [facility]: [facility];  Service: Endoscopy;  Laterality: N/A;  9:15 AM   COLONOSCOPY WITH PROPOFOL  N/A 01/25/2022   Procedure: COLONOSCOPY WITH PROPOFOL ;  Surgeon: Hollingshead Lamar CHRISTELLA, MD;  [facility]: [facility];  Service: Endoscopy;  Laterality: N/A;  11:15 / ASA 2   ESOPHAGOGASTRODUODENOSCOPY  (EGD) WITH PROPOFOL  N/A 09/10/2021   Procedure: ESOPHAGOGASTRODUODENOSCOPY (EGD) WITH PROPOFOL ;  Surgeon: Teressa Toribio SQUIBB, MD;  Location: [facility];  Service: Endoscopy;  Laterality: N/A;   EUS N/A 09/10/2021   Procedure: UPPER ENDOSCOPIC ULTRASOUND (EUS) RADIAL;  Surgeon: Teressa Toribio SQUIBB, MD;  Location: [facility];  Service: Endoscopy;  Laterality: N/A;   FINE NEEDLE ASPIRATION N/A 09/10/2021   Procedure: FINE NEEDLE ASPIRATION (FNA) LINEAR;  Surgeon: Teressa Toribio SQUIBB, MD;  Location: [facility];  Service: Endoscopy;  Laterality: N/A;   I & D EXTREMITY Right 01/08/2020   Procedure: IRRIGATION AND DEBRIDEMENT Right/Hand Thumb;  Surgeon: Camella Fallow, MD;  Location: [facility];  Service: Orthopedics;  Laterality: Right;   NEUROLYSIS OF MEDIAN NERVE Right 01/08/2020   Procedure: Neuroplasty Repair of complex laceration;  Surgeon: Camella Fallow, MD;  Location: [facility];  Service: Orthopedics;  Laterality: Right;   PERCUTANEOUS PINNING Right 01/08/2020   Procedure: Open Repair Fracture Right Hand/Thumb;  Surgeon: Camella Fallow, MD;  Location: [facility];  Service: Orthopedics;  Laterality: Right;   POLYPECTOMY  01/25/2022   Procedure: POLYPECTOMY;  Surgeon: Hollingshead Lamar CHRISTELLA, MD;  [facility]: [facility];  Service: Endoscopy;;   Social History   Tobacco Use   Smoking status: Never   Smokeless  tobacco: Never  Vaping Use   Vaping status: Never Used  Substance Use Topics   Alcohol use: No   Drug use: No   No family history on file. No Known Allergies  ROS    Objective:    BP (!) 150/102   Pulse 100   Temp 98 F (36.7 C)   Ht 6' (1.829 m)   Wt 180 lb (81.6 kg)   SpO2 98%   BMI 24.41 kg/m  BP Readings from Last 3 Encounters:  06/05/24 (!) 150/102  01/11/23 118/78  04/26/22 128/75   Wt Readings from Last 3 Encounters:  06/05/24 180 lb (81.6 kg)  01/11/23 193 lb 3.2 oz (87.6 kg)  04/09/22 186 lb (84.4 kg)    Physical Exam Vitals and nursing note reviewed.  Constitutional:       Appearance: Normal appearance.  HENT:     Head: Normocephalic.     Right Ear: Tympanic membrane, ear canal and external ear normal.     Left Ear: Tympanic membrane, ear canal and external ear normal.  Eyes:     Extraocular Movements: Extraocular movements intact.     Conjunctiva/sclera: Conjunctivae normal.     Pupils: Pupils are equal, round, and reactive to light.  Cardiovascular:     Rate and Rhythm: Normal rate and regular rhythm.     Heart sounds: Normal heart sounds.  Pulmonary:     Effort: Pulmonary effort is normal.     Breath sounds: Normal breath sounds.  Musculoskeletal:     Right lower leg: No edema.     Left lower leg: No edema.  Neurological:     General: No focal deficit present.     Mental Status: He is alert and oriented to person, place, and time.  Psychiatric:        Mood and Affect: Mood normal.        Behavior: Behavior normal.        Thought Content: Thought content normal.        Judgment: Judgment normal.      No results found for any visits on 06/05/24.

## 2024-06-06 ENCOUNTER — Ambulatory Visit: Payer: Self-pay | Admitting: Family Medicine

## 2024-06-06 LAB — COMPREHENSIVE METABOLIC PANEL WITH GFR
AG Ratio: 1.4 (calc) (ref 1.0–2.5)
ALT: 13 U/L (ref 9–46)
AST: 8 U/L — ABNORMAL LOW (ref 10–35)
Albumin: 4.5 g/dL (ref 3.6–5.1)
Alkaline phosphatase (APISO): 59 U/L (ref 35–144)
BUN: 19 mg/dL (ref 7–25)
CO2: 30 mmol/L (ref 20–32)
Calcium: 10 mg/dL (ref 8.6–10.3)
Chloride: 99 mmol/L (ref 98–110)
Creat: 1.22 mg/dL (ref 0.70–1.35)
Globulin: 3.2 g/dL (ref 1.9–3.7)
Glucose, Bld: 382 mg/dL — ABNORMAL HIGH (ref 65–99)
Potassium: 4.3 mmol/L (ref 3.5–5.3)
Sodium: 138 mmol/L (ref 135–146)
Total Bilirubin: 0.5 mg/dL (ref 0.2–1.2)
Total Protein: 7.7 g/dL (ref 6.1–8.1)
eGFR: 66 mL/min/1.73m2 (ref >=60)

## 2024-06-06 LAB — CBC WITH DIFFERENTIAL/PLATELET
Absolute Lymphocytes: 2528 {cells}/uL (ref 850–3900)
Absolute Monocytes: 705 {cells}/uL (ref 200–950)
Basophils Absolute: 43 {cells}/uL (ref 0–200)
Basophils Relative: 0.5 %
Eosinophils Absolute: 60 {cells}/uL (ref 15–500)
Eosinophils Relative: 0.7 %
HCT: 48.5 % (ref 38.5–50.0)
Hemoglobin: 16.3 g/dL (ref 13.2–17.1)
MCH: 30.8 pg (ref 27.0–33.0)
MCHC: 33.6 g/dL (ref 32.0–36.0)
MCV: 91.5 fL (ref 80.0–100.0)
MPV: 10.7 fL (ref 7.5–12.5)
Monocytes Relative: 8.2 %
Neutro Abs: 5263 {cells}/uL (ref 1500–7800)
Neutrophils Relative %: 61.2 %
Platelets: 211 Thousand/uL (ref 140–400)
RBC: 5.3 Million/uL (ref 4.20–5.80)
RDW: 12.3 % (ref 11.0–15.0)
Total Lymphocyte: 29.4 %
WBC: 8.6 Thousand/uL (ref 3.8–10.8)

## 2024-06-06 LAB — MICROALBUMIN / CREATININE URINE RATIO
Creatinine, Urine: 118 mg/dL (ref 20–320)
Microalb Creat Ratio: 41 mg/g{creat} — ABNORMAL HIGH (ref <30)
Microalb, Ur: 4.8 mg/dL

## 2024-06-06 LAB — LIPID PANEL
Cholesterol: 227 mg/dL — ABNORMAL HIGH (ref <200)
HDL: 53 mg/dL (ref >=40)
LDL Cholesterol (Calc): 152 mg/dL — ABNORMAL HIGH
Non-HDL Cholesterol (Calc): 174 mg/dL — ABNORMAL HIGH (ref <130)
Total CHOL/HDL Ratio: 4.3 (calc) (ref <5.0)
Triglycerides: 102 mg/dL (ref <150)

## 2024-06-06 LAB — HEMOGLOBIN A1C
Hgb A1c MFr Bld: 13 % — ABNORMAL HIGH (ref <5.7)
Mean Plasma Glucose: 326 mg/dL
eAG (mmol/L): 18.1 mmol/L

## 2024-06-07 ENCOUNTER — Other Ambulatory Visit: Payer: Self-pay

## 2024-06-07 MED ORDER — LANTUS SOLOSTAR 100 UNIT/ML ~~LOC~~ SOPN: 15.0000 [IU] | PEN_INJECTOR | Freq: Every evening | SUBCUTANEOUS | 1 refills | Status: AC

## 2024-06-07 MED ORDER — ROSUVASTATIN CALCIUM 20 MG PO TABS: 20.0000 mg | ORAL_TABLET | Freq: Every day | ORAL | 3 refills | Status: AC

## 2024-07-13 ENCOUNTER — Other Ambulatory Visit: Payer: Self-pay | Admitting: Family Medicine

## 2024-07-18 ENCOUNTER — Encounter: Payer: Self-pay | Admitting: Emergency Medicine

## 2024-07-18 ENCOUNTER — Ambulatory Visit
Admission: EM | Admit: 2024-07-18 | Discharge: 2024-07-18 | Disposition: A | Discharge status: Home / Self Care | Attending: Family Medicine | Admitting: Family Medicine

## 2024-07-18 DIAGNOSIS — K219 Gastro-esophageal reflux disease without esophagitis: Secondary | ICD-10-CM

## 2024-07-18 DIAGNOSIS — T63441A Toxic effect of venom of bees, accidental (unintentional), initial encounter: Secondary | ICD-10-CM

## 2024-07-18 MED ORDER — FAMOTIDINE 20 MG PO TABS: 20.0000 mg | ORAL_TABLET | Freq: Two times a day (BID) | ORAL | 0 refills | Status: AC

## 2024-07-18 MED ORDER — PANTOPRAZOLE SODIUM 40 MG PO TBEC: 40.0000 mg | DELAYED_RELEASE_TABLET | Freq: Every day | ORAL | 2 refills | Status: AC | PRN

## 2024-07-18 MED ORDER — CETIRIZINE HCL 5 MG PO TABS: 5.0000 mg | ORAL_TABLET | Freq: Two times a day (BID) | ORAL | 0 refills | Status: AC

## 2024-07-18 NOTE — Discharge Instructions (Signed)
 I have prescribed Zyrtec and Pepcid to be taken twice daily together to help with the localized allergic response to the bee stings on your arm and neck.  You may also ice the areas off-and-on and take over-the-counter pain relievers as needed.  I have also sent in a prescription for pantoprazole for your acid reflux.

## 2024-07-18 NOTE — ED Triage Notes (Signed)
 Insect bites on left side of neck and arm yesterday.  States areas hurt.  Took benadryl  last night.

## 2024-07-18 NOTE — ED Provider Notes (Signed)
 [facility]    CSN: [id number] Arrival date & time: 07/18/24  0827      History   Chief Complaint No chief complaint on file.   HPI Jose Peters is a 63 y.o. male.   Patient presenting today with painful insect bites to the left side of neck and 2 spots on the left arm that occurred yesterday.  States the areas are painful, swollen and red but denies chest tightness, shortness of breath, wheezing, throat itching or swelling, nausea, vomiting.  Tried a Benadryl  last night with minimal relief.  Also having uncontrolled acid reflux and no longer has any medication for this at home.    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   Open fracture of proximal phalanx of thumb 01/23/2020    Past Surgical History:  Procedure Laterality Date   COLONOSCOPY  12/08/2011   Procedure: COLONOSCOPY;  Surgeon: Lamar CHRISTELLA Hollingshead, MD;  [facility];  Service: Endoscopy;  Laterality: N/A;  9:15 AM   COLONOSCOPY WITH PROPOFOL  N/A 01/25/2022   Procedure: COLONOSCOPY WITH PROPOFOL ;  Surgeon: Hollingshead Lamar CHRISTELLA, MD;  [facility];  Service: Endoscopy;  Laterality: N/A;  11:15 / ASA 2   ESOPHAGOGASTRODUODENOSCOPY (EGD) WITH PROPOFOL  N/A 09/10/2021   Procedure: ESOPHAGOGASTRODUODENOSCOPY (EGD) WITH PROPOFOL ;  Surgeon: Teressa Toribio SQUIBB, MD;  [facility];  Service: Endoscopy;  Laterality: N/A;   EUS N/A 09/10/2021   Procedure: UPPER ENDOSCOPIC ULTRASOUND (EUS) RADIAL;  Surgeon: Teressa Toribio SQUIBB, MD;  [facility];   Service: Endoscopy;  Laterality: N/A;   FINE NEEDLE ASPIRATION N/A 09/10/2021   Procedure: FINE NEEDLE ASPIRATION (FNA) LINEAR;  Surgeon: Teressa Toribio SQUIBB, MD;  [facility]: [facility];  Service: Endoscopy;  Laterality: N/A;   I & D EXTREMITY Right 01/08/2020   Procedure: IRRIGATION AND DEBRIDEMENT Right/Hand Thumb;  Surgeon: Camella Fallow, MD;  Location: [facility];  Service: Orthopedics;  Laterality: Right;   NEUROLYSIS OF MEDIAN NERVE Right 01/08/2020   Procedure: Neuroplasty Repair of complex laceration;  Surgeon: Camella Fallow, MD;  Location: [facility];  Service: Orthopedics;  Laterality: Right;   PERCUTANEOUS PINNING Right 01/08/2020   Procedure: Open Repair Fracture Right Hand/Thumb;  Surgeon: Camella Fallow, MD;  Location: [facility] OR;  Service: Orthopedics;  Laterality: Right;   POLYPECTOMY  01/25/2022   Procedure: POLYPECTOMY;  Surgeon: Hollingshead Lamar CHRISTELLA, MD;  [facility];  Service: Endoscopy;;       Home Medications    Prior to Admission medications   Medication Sig Start Date End Date Taking? Authorizing Provider  cetirizine (ZYRTEC) 5 MG tablet Take 1 tablet (5 mg total) by mouth 2 (two) times daily. 07/18/24  Yes Stuart Vernell Norris, PA-C  famotidine (PEPCID) 20 MG tablet Take 1 tablet (20 mg total) by mouth 2 (two) times daily. 07/18/24  Yes Stuart Vernell Norris, PA-C  pantoprazole (PROTONIX) 40 MG tablet Take 1 tablet (40 mg total) by mouth daily as needed. 07/18/24  Yes Stuart Vernell Norris, PA-C  amLODipine (NORVASC) 10 MG tablet Take 10 mg by mouth in the  morning. 12/18/19   [provider]  Blood Glucose Monitoring Suppl (ACCU-CHEK GUIDE ME) w/Device KIT 1 Piece by Does not apply route as directed. 01/11/23   Nida, Gebreselassie W, MD  Continuous Glucose Receiver (DEXCOM G7 RECEIVER) DEVI Use to check glucose as directed Patient not taking: Reported on 06/05/2024 03/03/23   Lenis Ethelle ORN, MD  Continuous Glucose Sensor (DEXCOM G7 SENSOR) MISC Use to check  glucose continuously as directed. Change sensor every 10 days Patient not taking: Reported on 06/05/2024 03/03/23   Nida, Gebreselassie W, MD  escitalopram  (LEXAPRO ) 20 MG tablet Take 1 tablet (20 mg total) by mouth daily. 06/05/24   Aletha Bene, MD  glucose blood (ACCU-CHEK GUIDE) test strip Use to monitor glucose 4 times a day. 01/11/23   Nida, Gebreselassie W, MD  insulin  glargine (LANTUS  SOLOSTAR) 100 UNIT/ML Solostar Pen Inject 15 Units into the skin at bedtime. Increase by 2 units every 3 days until fasting sugars are in 150 range. 06/07/24   Aguiar, Rafaela, MD  JANUMET 50-1000 MG tablet Take 1 tablet by mouth 2 (two) times daily. 01/08/20   [provider]  losartan (COZAAR) 100 MG tablet Take 100 mg by mouth every evening. 11/04/19   [provider]  Multiple Vitamin (MULITIVITAMIN WITH MINERALS) TABS Take 1 tablet by mouth daily. men    [provider]  OZEMPIC , 1 MG/DOSE, 4 MG/3ML SOPN INJECT 1MG  SUBCUTANEOUSLY ONCE WEEKLY. 07/13/24   Aletha Bene, MD  rosuvastatin  (CRESTOR ) 20 MG tablet Take 1 tablet (20 mg total) by mouth daily. 06/07/24   Aletha Bene, MD  Semaglutide , 2 MG/DOSE, (OZEMPIC , 2 MG/DOSE,) 8 MG/3ML SOPN Inject 2 mg into the skin once a week. 01/18/23     tamsulosin (FLOMAX) 0.4 MG CAPS capsule Take 0.4 mg by mouth in the morning. 07/07/21   [provider]    Family History History reviewed. No pertinent family history.  Social History Social History   Tobacco Use   Smoking status: Never   Smokeless tobacco: Never  Vaping Use   Vaping status: Never Used  Substance Use Topics   Alcohol use: No   Drug use: No     Allergies   Patient has no known allergies.   Review of Systems Review of Systems Per HPI  Physical Exam Triage Vital Signs ED Triage Vitals  Encounter Vitals Group     BP 07/18/24 0908 (!) 143/79     Girls Systolic BP Percentile --      Girls Diastolic BP Percentile --      Boys Systolic BP Percentile --       Boys Diastolic BP Percentile --      Pulse Rate 07/18/24 0908 100     Resp 07/18/24 0908 18     Temp 07/18/24 0908 98 F (36.7 C)     Temp Source 07/18/24 0908 Oral     SpO2 07/18/24 0908 97 %     Weight --      Height --      Head Circumference --      Peak Flow --      Pain Score 07/18/24 0909 7     Pain Loc --      Pain Education --      Exclude from Growth Chart --    No data found.  Updated Vital Signs BP (!) 143/79 (BP Location: Right Arm)   Pulse 100   Temp 98 F (36.7 C) (Oral)   Resp 18   SpO2  97%   Visual Acuity Right Eye Distance:   Left Eye Distance:   Bilateral Distance:    Right Eye Near:   Left Eye Near:    Bilateral Near:     Physical Exam Vitals and nursing note reviewed.  Constitutional:      Appearance: Normal appearance.  HENT:     Head: Atraumatic.     Nose: Nose normal.     Mouth/Throat:     Mouth: Mucous membranes are moist.     Pharynx: Oropharynx is clear. No posterior oropharyngeal erythema.  Eyes:     Extraocular Movements: Extraocular movements intact.     Conjunctiva/sclera: Conjunctivae normal.  Cardiovascular:     Rate and Rhythm: Normal rate and regular rhythm.  Pulmonary:     Effort: Pulmonary effort is normal. No respiratory distress.     Breath sounds: Normal breath sounds. No wheezing or rales.  Musculoskeletal:        General: Normal range of motion.     Cervical back: Normal range of motion and neck supple.  Skin:    General: Skin is warm and dry.     Comments: 3 erythematous papular lesions from insect stings, 1 to left side of neck, 2 to left arm.  Slightly edematous  Neurological:     Mental Status: He is oriented to person, place, and time.     Motor: No weakness.     Gait: Gait normal.  Psychiatric:        Mood and Affect: Mood normal.        Thought Content: Thought content normal.        Judgment: Judgment normal.      UC Treatments / Results  Labs (all labs ordered are listed, but only abnormal  results are displayed) Labs Reviewed - No data to display  EKG   Radiology No results found.  Procedures Procedures (including critical care time)  Medications Ordered in UC Medications - No data to display  Initial Impression / Assessment and Plan / UC Course  I have reviewed the triage vital signs and the nursing notes.  Pertinent labs & imaging results that were available during my care of the patient were reviewed by me and considered in my medical decision making (see chart for details).     Treat with Zyrtec and Pepcid for insect stings, ice as needed for swelling and pain.  No evidence of progressing or anaphylactic reaction today.  Will also send Protonix for GERD and discussed lifestyle modifications additionally for control.  Return for worsening or unresolving symptoms.  Final Clinical Impressions(s) / UC Diagnoses   Final diagnoses:  Gastroesophageal reflux disease, unspecified whether esophagitis present  Bee sting, accidental or unintentional, initial encounter     Discharge Instructions      I have prescribed Zyrtec and Pepcid to be taken twice daily together to help with the localized allergic response to the bee stings on your arm and neck.  You may also ice the areas off-and-on and take over-the-counter pain relievers as needed.  I have also sent in a prescription for pantoprazole for your acid reflux.    ED Prescriptions     Medication Sig Dispense Auth. Provider   famotidine (PEPCID) 20 MG tablet Take 1 tablet (20 mg total) by mouth 2 (two) times daily. 30 tablet Stuart Vernell Norris, PA-C   cetirizine (ZYRTEC) 5 MG tablet Take 1 tablet (5 mg total) by mouth 2 (two) times daily. 20 tablet Stuart Vernell Norris, PA-C  pantoprazole (PROTONIX) 40 MG tablet Take 1 tablet (40 mg total) by mouth daily as needed. 30 tablet Stuart Vernell Norris, [place]      PDMP not reviewed this encounter.   Stuart Vernell Norris, [place] 07/18/24 [phone]

## 2024-08-31 ENCOUNTER — Telehealth: Payer: Self-pay

## 2024-08-31 NOTE — Telephone Encounter (Signed)
 Patient was identified as falling into the [place] Measure - Diabetes.   Patient was: Requires a call back at a later time. Patients VM full. Patient will need lab appt for A1C prior to year end.

## 2024-09-10 ENCOUNTER — Telehealth: Payer: Self-pay

## 2024-09-10 NOTE — Telephone Encounter (Signed)
 Patient was identified as falling into the [place] Measure - Diabetes.   Patient was: Appointment scheduled with primary care provider in the next 30 days. Appt 12/19.

## 2024-09-13 ENCOUNTER — Other Ambulatory Visit: Payer: Self-pay | Admitting: Family Medicine

## 2024-09-14 ENCOUNTER — Ambulatory Visit: Admitting: Family Medicine
